# Patient Record
Sex: Female | Born: 1940 | Race: Black or African American | Hispanic: No | Smoking: Never smoker
Health system: Southern US, Community
[De-identification: ages and names within clinical notes are randomized; demographics above are authoritative.]

## PROBLEM LIST (undated history)

## (undated) DIAGNOSIS — I5189 Other ill-defined heart diseases: Secondary | ICD-10-CM

## (undated) DIAGNOSIS — I499 Cardiac arrhythmia, unspecified: Secondary | ICD-10-CM

## (undated) DIAGNOSIS — I447 Left bundle-branch block, unspecified: Secondary | ICD-10-CM

## (undated) DIAGNOSIS — E119 Type 2 diabetes mellitus without complications: Secondary | ICD-10-CM

## (undated) DIAGNOSIS — Z972 Presence of dental prosthetic device (complete) (partial): Secondary | ICD-10-CM

## (undated) DIAGNOSIS — E785 Hyperlipidemia, unspecified: Secondary | ICD-10-CM

## (undated) DIAGNOSIS — I10 Essential (primary) hypertension: Secondary | ICD-10-CM

## (undated) HISTORY — DX: Type 2 diabetes mellitus without complications: E11.9

## (undated) HISTORY — DX: Cardiac arrhythmia, unspecified: I49.9

## (undated) HISTORY — DX: Other ill-defined heart diseases: I51.89

## (undated) HISTORY — DX: Left bundle-branch block, unspecified: I44.7

## (undated) HISTORY — PX: KNEE ARTHROSCOPY: SUR90

## (undated) HISTORY — DX: Essential (primary) hypertension: I10

## (undated) HISTORY — DX: Hyperlipidemia, unspecified: E78.5

---

## 2004-02-19 ENCOUNTER — Emergency Department: Payer: Self-pay | Admitting: Unknown Physician Specialty

## 2004-02-27 ENCOUNTER — Ambulatory Visit: Payer: Self-pay | Admitting: Internal Medicine

## 2004-06-03 ENCOUNTER — Ambulatory Visit: Payer: Self-pay | Admitting: Specialist

## 2004-06-03 ENCOUNTER — Other Ambulatory Visit: Payer: Self-pay

## 2004-06-10 ENCOUNTER — Ambulatory Visit: Payer: Self-pay | Admitting: Specialist

## 2007-05-14 ENCOUNTER — Emergency Department: Payer: Self-pay | Admitting: Emergency Medicine

## 2008-08-21 ENCOUNTER — Emergency Department: Payer: Self-pay

## 2011-03-18 ENCOUNTER — Ambulatory Visit: Payer: Self-pay | Admitting: Internal Medicine

## 2011-11-05 ENCOUNTER — Emergency Department: Payer: Self-pay | Admitting: Emergency Medicine

## 2014-08-26 ENCOUNTER — Other Ambulatory Visit: Payer: Self-pay | Admitting: Internal Medicine

## 2014-08-26 DIAGNOSIS — Z1231 Encounter for screening mammogram for malignant neoplasm of breast: Secondary | ICD-10-CM

## 2014-09-04 ENCOUNTER — Ambulatory Visit
Admission: RE | Admit: 2014-09-04 | Discharge: 2014-09-04 | Disposition: A | Discharge status: Home / Self Care | Payer: Medicare Other | Source: Ambulatory Visit | Attending: Internal Medicine | Admitting: Internal Medicine

## 2014-09-04 ENCOUNTER — Other Ambulatory Visit: Payer: Self-pay | Admitting: Internal Medicine

## 2014-09-04 DIAGNOSIS — Z1231 Encounter for screening mammogram for malignant neoplasm of breast: Secondary | ICD-10-CM | POA: Insufficient documentation

## 2015-10-19 ENCOUNTER — Other Ambulatory Visit: Payer: Self-pay | Admitting: Internal Medicine

## 2015-10-19 DIAGNOSIS — Z1231 Encounter for screening mammogram for malignant neoplasm of breast: Secondary | ICD-10-CM

## 2015-10-20 ENCOUNTER — Ambulatory Visit
Admission: RE | Admit: 2015-10-20 | Discharge: 2015-10-20 | Disposition: A | Discharge status: Home / Self Care | Payer: Medicare Other | Source: Ambulatory Visit | Attending: Internal Medicine | Admitting: Internal Medicine

## 2015-10-20 DIAGNOSIS — Z1231 Encounter for screening mammogram for malignant neoplasm of breast: Secondary | ICD-10-CM | POA: Insufficient documentation

## 2017-03-29 ENCOUNTER — Encounter: Payer: Self-pay | Admitting: Primary Care

## 2017-03-29 ENCOUNTER — Ambulatory Visit (INDEPENDENT_AMBULATORY_CARE_PROVIDER_SITE_OTHER): Payer: Medicare Other | Admitting: Primary Care

## 2017-03-29 VITALS — BP 126/78 | HR 75 | Temp 98.2°F | Ht 66.0 in | Wt 230.0 lb

## 2017-03-29 DIAGNOSIS — E119 Type 2 diabetes mellitus without complications: Secondary | ICD-10-CM | POA: Diagnosis not present

## 2017-03-29 DIAGNOSIS — E785 Hyperlipidemia, unspecified: Secondary | ICD-10-CM | POA: Diagnosis not present

## 2017-03-29 DIAGNOSIS — I10 Essential (primary) hypertension: Secondary | ICD-10-CM

## 2017-03-29 NOTE — Assessment & Plan Note (Signed)
Endorses being off of metoprolol succinate and clonidine since October 2018.  Blood pressure in the office today stable off of medications.  We will have her start monitoring blood pressure at home, continue to hold medications as her blood pressure is within normal limits.

## 2017-03-29 NOTE — Assessment & Plan Note (Signed)
Managed on lovastatin 20 mg for years.  Lipids pending, she will return when fasting.

## 2017-03-29 NOTE — Patient Instructions (Addendum)
Continue to time your urination.  Limit water before bedtime, do not drink within 1-2 hours prior to bedtime.  Try the kegal exercises below.   Start monitoring your blood pressure at home or at the drug store. Please notify me if you get readings at or above 140/90.  Schedule a lab only appointment to return back for fasting labs. You may have water and black coffee. Do not eat 4 hours prior.  Please schedule a follow up appointment in 6 months.   It was a pleasure to meet you today! Please don't hesitate to call or message me with any questions. Welcome to [facility]!     Kegel Exercises Kegel exercises help strengthen the muscles that support the rectum, vagina, small intestine, bladder, and uterus. Doing Kegel exercises can help:  Improve bladder and bowel control.  Improve sexual response.  Reduce problems and discomfort during pregnancy.  Kegel exercises involve squeezing your pelvic floor muscles, which are the same muscles you squeeze when you try to stop the flow of urine. The exercises can be done while sitting, standing, or lying down, but it is best to vary your position. Phase 1 exercises 1. Squeeze your pelvic floor muscles tight. You should feel a tight lift in your rectal area. If you are a female, you should also feel a tightness in your vaginal area. Keep your stomach, buttocks, and legs relaxed. 2. Hold the muscles tight for up to 10 seconds. 3. Relax your muscles. Repeat this exercise 50 times a day or as many times as told by your health care provider. Continue to do this exercise for at least 4-6 weeks or for as long as told by your health care provider. This information is not intended to replace advice given to you by your health care provider. Make sure you discuss any questions you have with your health care provider. Document Released: 01/04/2012 Document Revised: 09/12/2015 Document Reviewed: 12/07/2014 Elsevier Interactive Patient Education  [employer]2018  Elsevier Inc.

## 2017-03-29 NOTE — Progress Notes (Signed)
Subjective:    Patient ID: Belinda EchevariaMary L Patton, female    DOB: [birth year], 77 y.o.   MRN: [id number]  HPI  Belinda Patton is a 77 year old female who presents today to establish care and discuss the problems mentioned below. Will obtain old records.  1) Hyperlipidemia: Currently managed on lovastatin 20 mg. Her last cholesterol check was in October she thinks, not sure if it was normal. She is not fasting today.   2) Type 2 Diabetes: Currently managed on saxagliptin-metformin 05-998 mg once daily. She thinks her A1C was last checked in October 2018.  She is not sure of her last A1c number.  3) Essential Hypertension: Currently managed on metoprolol succinate 100 mg and clonidine 0.2 mg twice daily. She's been on this regimen for years. She denies chest pain, shortness of breath, headaches. She ran out of her blood pressure medication in October 2018. She does not check her blood pressure at home.   BP Readings from Last 3 Encounters:  03/29/17 126/78   4) Urinary Incontinence: Present for the past three months. Mostly occurs at night when she gets up to use the bathroom. If she doesn't get to the bathroom soon enough she'll wet herself. She's also started notice leakage of urine during the day and is now wearing pads. She has trained herself to urinate every 1-2 hours. She drinks coffee every morning with her last cup being at 9 am. She doesn't drink soda or tea. She drinks three bottles of water daily with her last dose being before bedtime.   She receives pads through her insurance. She does not experience urinary tract infections.    Review of Systems  Constitutional: Negative for unexpected weight change.  Eyes: Negative for visual disturbance.  Respiratory: Negative for shortness of breath.   Cardiovascular: Negative for chest pain.  Genitourinary:       Urinary incontinence  Neurological: Negative for dizziness, numbness and headaches.       Past Medical History:  Diagnosis Date  .  Essential hypertension   . Hyperlipidemia   . Type 2 diabetes mellitus (HCC)      Social History   Socioeconomic History  . Marital status: Widowed    Spouse name: Not on file  . Number of children: Not on file  . Years of education: Not on file  . Highest education level: Not on file  Social Needs  . Financial resource strain: Not on file  . Food insecurity - worry: Not on file  . Food insecurity - inability: Not on file  . Transportation needs - medical: Not on file  . Transportation needs - non-medical: Not on file  Occupational History  . Not on file  Tobacco Use  . Smoking status: Never Smoker  . Smokeless tobacco: Never Used  Substance and Sexual Activity  . Alcohol use: No    Frequency: Never  . Drug use: Not on file  . Sexual activity: Not on file  Other Topics Concern  . Not on file  Social History Narrative   Widower.   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   Enjoys spending time with grandchildren, gardening.    Past Surgical History:  Procedure Laterality Date  . KNEE ARTHROSCOPY Right     Family History  Problem Relation Age of Onset  . Hypertension Mother   . COPD Father   . Lung cancer Father   . Diabetes Paternal Grandfather   . Breast cancer  Neg Hx     Allergies  Allergen Reactions  . Penicillins Itching    Current Outpatient Medications on File Prior to Visit  Medication Sig Dispense Refill  . cloNIDine (CATAPRES) 0.2 MG tablet Take 1 tablet by mouth twice a day for blood pressure    . lovastatin (MEVACOR) 20 MG tablet TAKE 1 TABLET BY MOUTH ONCE A DAY FOR LIPIDS  4  . metoprolol succinate (TOPROL-XL) 100 MG 24 hr tablet Take 1 tablet by mouth every night at bedtime for high blood pressure    . Saxagliptin-Metformin (KOMBIGLYZE XR) 05-998 MG TB24 Take 1 tablet by mouth daily.     No current facility-administered medications on file prior to visit.     BP 126/78   Pulse 75   Temp 98.2 F (36.8  C) (Oral)   Ht 5\' 6"  (1.676 m)   Wt 230 lb (104.3 kg)   SpO2 99%   BMI 37.12 kg/m    Objective:   Physical Exam  Constitutional: She appears well-nourished.  Neck: Neck supple.  Cardiovascular: Normal rate and regular rhythm.  Pulmonary/Chest: Effort normal and breath sounds normal.  Skin: Skin is warm and dry.  Psychiatric: She has a normal mood and affect.          Assessment & Plan:

## 2017-03-29 NOTE — Assessment & Plan Note (Signed)
Managed on saxagliptin-metformin once daily.  Repeat A1c pending.  Urine microalbumin pending.

## 2017-03-31 ENCOUNTER — Other Ambulatory Visit: Payer: Self-pay | Admitting: Primary Care

## 2017-03-31 ENCOUNTER — Other Ambulatory Visit (INDEPENDENT_AMBULATORY_CARE_PROVIDER_SITE_OTHER): Payer: Medicare Other

## 2017-03-31 DIAGNOSIS — E785 Hyperlipidemia, unspecified: Secondary | ICD-10-CM

## 2017-03-31 DIAGNOSIS — E119 Type 2 diabetes mellitus without complications: Secondary | ICD-10-CM

## 2017-03-31 LAB — LIPID PANEL
CHOL/HDL RATIO: 2
Cholesterol: 132 mg/dL (ref 0–200)
HDL: 67.7 mg/dL (ref >39.00)
LDL CALC: 52 mg/dL (ref 0–99)
NonHDL: 63.88
Triglycerides: 57 mg/dL (ref 0.0–149.0)
VLDL: 11.4 mg/dL (ref 0.0–40.0)

## 2017-03-31 LAB — COMPREHENSIVE METABOLIC PANEL
ALT: 11 U/L (ref 0–35)
AST: 16 U/L (ref 0–37)
Albumin: 3.9 g/dL (ref 3.5–5.2)
Alkaline Phosphatase: 73 U/L (ref 39–117)
BUN: 24 mg/dL — AB (ref 6–23)
CHLORIDE: 107 meq/L (ref 96–112)
CO2: 29 meq/L (ref 19–32)
Calcium: 10.1 mg/dL (ref 8.4–10.5)
Creatinine, Ser: 1.18 mg/dL (ref 0.40–1.20)
GFR: 56.88 mL/min — AB (ref >60.00)
GLUCOSE: 127 mg/dL — AB (ref 70–99)
POTASSIUM: 4.5 meq/L (ref 3.5–5.1)
SODIUM: 142 meq/L (ref 135–145)
Total Bilirubin: 0.6 mg/dL (ref 0.2–1.2)
Total Protein: 7.6 g/dL (ref 6.0–8.3)

## 2017-03-31 LAB — MICROALBUMIN / CREATININE URINE RATIO
Creatinine,U: 177.7 mg/dL
Microalb Creat Ratio: 1.3 mg/g (ref 0.0–30.0)
Microalb, Ur: 2.3 mg/dL — ABNORMAL HIGH (ref 0.0–1.9)

## 2017-03-31 LAB — HEMOGLOBIN A1C: Hgb A1c MFr Bld: 7 % — ABNORMAL HIGH (ref 4.6–6.5)

## 2017-03-31 MED ORDER — SAXAGLIPTIN-METFORMIN ER 5-1000 MG PO TB24: ORAL_TABLET | ORAL | 1 refills | Status: DC

## 2017-03-31 MED ORDER — LOVASTATIN 20 MG PO TABS: ORAL_TABLET | ORAL | 3 refills | Status: DC

## 2017-09-26 ENCOUNTER — Ambulatory Visit: Payer: Medicare Other | Admitting: Primary Care

## 2017-09-26 DIAGNOSIS — Z0289 Encounter for other administrative examinations: Secondary | ICD-10-CM

## 2018-08-01 ENCOUNTER — Other Ambulatory Visit: Payer: Self-pay | Admitting: Family Medicine

## 2018-08-01 DIAGNOSIS — Z1231 Encounter for screening mammogram for malignant neoplasm of breast: Secondary | ICD-10-CM

## 2018-08-16 ENCOUNTER — Other Ambulatory Visit: Payer: Self-pay | Admitting: Family Medicine

## 2018-08-16 DIAGNOSIS — M79604 Pain in right leg: Secondary | ICD-10-CM

## 2018-08-16 DIAGNOSIS — Z13820 Encounter for screening for osteoporosis: Secondary | ICD-10-CM

## 2018-08-22 ENCOUNTER — Ambulatory Visit
Admission: RE | Admit: 2018-08-22 | Discharge: 2018-08-22 | Disposition: A | Discharge status: Home / Self Care | Payer: Medicare Other | Source: Ambulatory Visit | Attending: Family Medicine | Admitting: Family Medicine

## 2018-08-22 ENCOUNTER — Other Ambulatory Visit: Payer: Self-pay

## 2018-08-22 ENCOUNTER — Encounter (INDEPENDENT_AMBULATORY_CARE_PROVIDER_SITE_OTHER): Payer: Self-pay

## 2018-08-22 DIAGNOSIS — M79604 Pain in right leg: Secondary | ICD-10-CM | POA: Diagnosis present

## 2018-08-29 ENCOUNTER — Other Ambulatory Visit: Payer: Self-pay

## 2018-08-29 ENCOUNTER — Ambulatory Visit (INDEPENDENT_AMBULATORY_CARE_PROVIDER_SITE_OTHER): Payer: Medicare Other | Admitting: Cardiology

## 2018-08-29 ENCOUNTER — Telehealth: Payer: Self-pay

## 2018-08-29 ENCOUNTER — Encounter: Payer: Self-pay | Admitting: Cardiology

## 2018-08-29 VITALS — BP 158/92 | HR 80 | Ht 69.0 in | Wt 229.5 lb

## 2018-08-29 DIAGNOSIS — I447 Left bundle-branch block, unspecified: Secondary | ICD-10-CM

## 2018-08-29 DIAGNOSIS — M7989 Other specified soft tissue disorders: Secondary | ICD-10-CM

## 2018-08-29 DIAGNOSIS — I10 Essential (primary) hypertension: Secondary | ICD-10-CM | POA: Diagnosis not present

## 2018-08-29 DIAGNOSIS — R0602 Shortness of breath: Secondary | ICD-10-CM

## 2018-08-29 DIAGNOSIS — Z7189 Other specified counseling: Secondary | ICD-10-CM

## 2018-08-29 DIAGNOSIS — R5382 Chronic fatigue, unspecified: Secondary | ICD-10-CM

## 2018-08-29 NOTE — Telephone Encounter (Signed)

## 2018-08-29 NOTE — Progress Notes (Signed)
Cardiology Office Note:    Date:  08/29/2018   ID:  Belinda Patton, DOB Jun 10, 1940, MRN [id number]  PCP:  Doreene Nest, NP  Cardiologist:  Jodelle Red, MD PhD  Referring MD: Doreene Nest, NP   Chief Complaint  Patient presents with  . OTHER    Irregular heart beat and ankle swelling. Meds reviewed verbally with pt.    History of Present Illness:    Belinda Patton is a 78 y.o. female with a hx of hypertension, type II diabetes who is seen as a new consult at the request of Doreene Nest, NP for the evaluation and management of irregular heartbeat, abnormal ECG.  Her concerns today: Has felt short of breath, gets tired more easily. This has been gradual over the last year or so. No clear event that started it.   Per notes, reported irregular heartbeat at a visit, but ECG not available at the time. On repeat visit several weeks later, was regular sinus rhythm. She was feeling fine at the time. She notes racing beats when she is upset, which happens very rarely. No syncope. No chest pain. Never been told she has issues with her heart.   No PND or orthopnea. Has chronic LE edema, L>R, for some time (not sure how long). Weight fluctuates within 5-10 lbs, and she has actually been losing weight since cutting back on high sugar foods.  She reports being very nervous for her visit today. Doesn't check BP at home. Per notes, most recently at PCP her Bps were 154/88 and 147/77.   Family history: Son had stents put in, died age 67, was a heavy drinker.    Past Medical History:  Diagnosis Date  . Essential hypertension   . Hyperlipidemia   . Type 2 diabetes mellitus (HCC)     Past Surgical History:  Procedure Laterality Date  . KNEE ARTHROSCOPY Right     Current Medications: Current Outpatient Medications on File Prior to Visit  Medication Sig  . lisinopril (ZESTRIL) 2.5 MG tablet Take 2.5 mg by mouth daily.  . metFORMIN (GLUCOPHAGE) 500 MG tablet Take 500 mg  by mouth daily with breakfast.   . metoprolol succinate (TOPROL-XL) 100 MG 24 hr tablet Take 100 mg by mouth daily. Take with or immediately following a meal.   No current facility-administered medications on file prior to visit.      Allergies:   Bee venom and Penicillins   Social History   Socioeconomic History  . Marital status: Widowed    Spouse name: Not on file  . Number of children: Not on file  . Years of education: Not on file  . Highest education level: Not on file  Occupational History  . Not on file  Social Needs  . Financial resource strain: Not on file  . Food insecurity    Worry: Not on file    Inability: Not on file  . Transportation needs    Medical: Not on file    Non-medical: Not on file  Tobacco Use  . Smoking status: Never Smoker  . Smokeless tobacco: Never Used  Substance and Sexual Activity  . Alcohol use: No    Frequency: Never  . Drug use: Not Currently  . Sexual activity: Not on file  Lifestyle  . Physical activity    Days per week: Not on file    Minutes per session: Not on file  . Stress: Not on file  Relationships  . Social connections  Talks on phone: Not on file    Gets together: Not on file    Attends religious service: Not on file    Active member of club or organization: Not on file    Attends meetings of clubs or organizations: Not on file    Relationship status: Not on file  Other Topics Concern  . Not on file  Social History Narrative   Widower.   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   Enjoys spending time with grandchildren, gardening.     Family History: The patient's family history includes COPD in her father; Diabetes in her paternal grandfather; Heart Problems in her son; Hypertension in her mother; Lung cancer in her father. There is no history of Breast cancer.  ROS:   Please see the history of present illness.  Additional pertinent ROS:  Constitutional: Negative for  chills, fever, night sweats, unintentional weight loss  HENT: Negative for ear pain and hearing loss.   Eyes: Negative for loss of vision and eye pain.  Respiratory: Negative for cough, sputum, shortness of breath, wheezing.   Cardiovascular: See HPI. Gastrointestinal: Negative for abdominal pain, melena, and hematochezia.  Genitourinary: Negative for dysuria and hematuria.  Musculoskeletal: Negative for falls and myalgias.  Skin: Negative for itching and rash.  Neurological: Negative for focal weakness, focal sensory changes and loss of consciousness.  Endo/Heme/Allergies: Does not bruise/bleed easily.    EKGs/Labs/Other Studies Reviewed:    The following studies were reviewed today: I reviewed records send from PCP. On my review, at visit 08/01/18 her rate was irregular on physical exam but no ECG available. When she was seen for follow up on 08/16/18, her rhythm was regular, ECG showed LBBB. This was also seen on prior ECG (most recent prior here 2010)  EKG:  EKG is personally reviewed.  The ekg ordered today demonstrates SR at 80 bpm, LBBB  Recent Labs: No results found for requested labs within last 8760 hours.  Recent Lipid Panel    Component Value Date/Time   CHOL 132 03/31/2017 0857   TRIG 57.0 03/31/2017 0857   HDL 67.70 03/31/2017 0857   CHOLHDL 2 03/31/2017 0857   VLDL 11.4 03/31/2017 0857   LDLCALC 52 03/31/2017 0857    Physical Exam:    VS:  BP (!) 158/92 (BP Location: Right Arm, Patient Position: Sitting, Cuff Size: Large)   Pulse 80   Ht 5\' 9"  (1.753 m)   Wt 229 lb 8 oz (104.1 kg)   BMI 33.89 kg/m     Wt Readings from Last 3 Encounters:  08/29/18 229 lb 8 oz (104.1 kg)  03/29/17 230 lb (104.3 kg)     GEN: Well nourished, well developed in no acute distress HEENT: Normal NECK: No JVD; No carotid bruits LYMPHATICS: No lymphadenopathy CARDIAC: regular rhythm, normal S1 and S2, no murmurs, rubs, gallops. Radial 2+ bilaterally. Unable to palpate pulses  through edema in lower extremities but they are warm with normal capillary refill. RESPIRATORY:  Clear to auscultation without rales, wheezing or rhonchi  ABDOMEN: Soft, non-tender, non-distended MUSCULOSKELETAL:  Bilateral edema, L>R, trace on R and 1+ on left SKIN: Warm and dry NEUROLOGIC:  Alert and oriented x 3 PSYCHIATRIC:  Normal affect   ASSESSMENT:    1. SOB (shortness of breath)   2. Left bundle branch block   3. Swelling of lower extremity   4. Essential hypertension   5. Cardiac risk counseling   6. Counseling on health promotion  and disease prevention   7. Chronic fatigue    PLAN:    Fatigue, exertional shortness of breath, LE edema in the setting of LBBB: her LBBB is not new (>80 years old), but her progressive symptoms are concerning that she may have some dysfunction due to longstanding dyssynchrony. No prior echo that I can see. -echocardiogram -no chest pain ever, but if echo unrevealing could consider lexiscan for further evaluation -risk factors of age, postmenopausal, diabetes, hypertension -last LDL 78 per records. PCP notes state she is on lovastatin 20 mg daily, she is unsure if she is taking this.  Hypertension: not at goal today, but consistent with prior readings at PCP -on lisinopril 2.5 mg, metoprolol succinate 100 mg daily -would uptitrate lisinopril if remains not at goal if echo abnormal. Otherwise consider spironolactone if evidence of diastolic dysfunction or thiazide given LE edema. -notes from PCP also state she is on clonidine 0.2 mg BID, she is unclear if she is taking this.  -as it is unclear what she is taking, will defer to PCP on titration at this time.  Cardiac risk counseling and prevention recommendations: -recommend heart healthy/Mediterranean diet, with whole grains, fruits, vegetable, fish, lean meats, nuts, and olive oil. Limit salt. -recommend moderate walking, 3-5 times/week for 30-50 minutes each session. Aim for at least 150  minutes.week. Goal should be pace of 3 miles/hours, or walking 1.5 miles in 30 minutes -recommend avoidance of tobacco products. Avoid excess alcohol. -diabetes, hypertension, hyperlipidemia management as above  Irregular beats: very rare, brief. Unclear what her rhythm was on 7/1 as ECG not done, but as this is rare and not very symptomatic would watch for now. If becomes more frequent would order a monitor.  Plan for follow up: 6 mos or sooner PRN  Medication Adjustments/Labs and Tests Ordered: Current medicines are reviewed at length with the patient today.  Concerns regarding medicines are outlined above.  Orders Placed This Encounter  Procedures  . ECHOCARDIOGRAM COMPLETE   No orders of the defined types were placed in this encounter.   Patient Instructions  Medication Instructions:  Your physician recommends that you continue on your current medications as directed. Please refer to the Current Medication list given to you today.   If you need a refill on your cardiac medications before your next appointment, please call your pharmacy.   Lab work: - None ordered.  If you have labs (blood work) drawn today and your tests are completely normal, you will receive your results only by: Marland Kitchen. MyChart Message (if you have MyChart) OR . A paper copy in the mail If you have any lab test that is abnormal or we need to change your treatment, we will call you to review the results.  Testing/Procedures: Your physician has requested that you have an echocardiogram. Echocardiography is a painless test that uses sound waves to create images of your heart. It provides your doctor with information about the size and shape of your heart and how well your heart's chambers and valves are working. This procedure takes approximately one hour. There are no restrictions for this procedure. You may get an IV, if needed, to receive an ultrasound enhancing agent through to better visualize your heart.     Follow-Up: At [facility], you and your health needs are our priority.  As part of our continuing mission to provide you with exceptional heart care, we have created designated Provider Care Teams.  These Care Teams include your primary Cardiologist (physician) and Advanced  Practice Providers (APPs -  Physician Assistants and Nurse Practitioners) who all work together to provide you with the care you need, when you need it. You will need a follow up appointment in 6 months.  Please call our office 2 months in advance to schedule this appointment.  You may see Dr Jodelle RedBridgette Harshal Sirmon or one of the following Advanced Practice Providers on your designated Care Team:   Nicolasa Duckinghristopher Berge, NP Eula Listenyan Dunn, PA-C . Marisue IvanJacquelyn Visser, PA-C     Echocardiogram An echocardiogram is a procedure that uses painless sound waves (ultrasound) to produce an image of the heart. Images from an echocardiogram can provide important information about:  Signs of coronary artery disease (CAD).  Aneurysm detection. An aneurysm is a weak or damaged part of an artery wall that bulges out from the normal force of blood pumping through the body.  Heart size and shape. Changes in the size or shape of the heart can be associated with certain conditions, including heart failure, aneurysm, and CAD.  Heart muscle function.  Heart valve function.  Signs of a past heart attack.  Fluid buildup around the heart.  Thickening of the heart muscle.  A tumor or infectious growth around the heart valves. Tell a health care provider about:  Any allergies you have.  All medicines you are taking, including vitamins, herbs, eye drops, creams, and over-the-counter medicines.  Any blood disorders you have.  Any surgeries you have had.  Any medical conditions you have.  Whether you are pregnant or may be pregnant. What are the risks? Generally, this is a safe procedure. However, problems may occur, including:  Allergic  reaction to dye (contrast) that may be used during the procedure. What happens before the procedure? No specific preparation is needed. You may eat and drink normally. What happens during the procedure?   An IV tube may be inserted into one of your veins.  You may receive contrast through this tube. A contrast is an injection that improves the quality of the pictures from your heart.  A gel will be applied to your chest.  A wand-like tool (transducer) will be moved over your chest. The gel will help to transmit the sound waves from the transducer.  The sound waves will harmlessly bounce off of your heart to allow the heart images to be captured in real-time motion. The images will be recorded on a computer. The procedure may vary among health care providers and hospitals. What happens after the procedure?  You may return to your normal, everyday life, including diet, activities, and medicines, unless your health care provider tells you not to do that. Summary  An echocardiogram is a procedure that uses painless sound waves (ultrasound) to produce an image of the heart.  Images from an echocardiogram can provide important information about the size and shape of your heart, heart muscle function, heart valve function, and fluid buildup around your heart.  You do not need to do anything to prepare before this procedure. You may eat and drink normally.  After the echocardiogram is completed, you may return to your normal, everyday life, unless your health care provider tells you not to do that. This information is not intended to replace advice given to you by your health care provider. Make sure you discuss any questions you have with your health care provider. Document Released: 01/15/2000 Document Revised: 05/10/2018 Document Reviewed: 02/20/2016 Elsevier Patient Education  2020 [employer]      Signed, Jodelle RedBridgette Nava Song, MD PhD  08/29/2018 4:49 PM    [facility]

## 2018-08-29 NOTE — Patient Instructions (Signed)
Medication Instructions:  Your physician recommends that you continue on your current medications as directed. Please refer to the Current Medication list given to you today.   If you need a refill on your cardiac medications before your next appointment, please call your pharmacy.   Lab work: - None ordered.  If you have labs (blood work) drawn today and your tests are completely normal, you will receive your results only by: Marland Kitchen. MyChart Message (if you have MyChart) OR . A paper copy in the mail If you have any lab test that is abnormal or we need to change your treatment, we will call you to review the results.  Testing/Procedures: Your physician has requested that you have an echocardiogram. Echocardiography is a painless test that uses sound waves to create images of your heart. It provides your doctor with information about the size and shape of your heart and how well your heart's chambers and valves are working. This procedure takes approximately one hour. There are no restrictions for this procedure. You may get an IV, if needed, to receive an ultrasound enhancing agent through to better visualize your heart.    Follow-Up: At [facility], you and your health needs are our priority.  As part of our continuing mission to provide you with exceptional heart care, we have created designated Provider Care Teams.  These Care Teams include your primary Cardiologist (physician) and Advanced Practice Providers (APPs -  Physician Assistants and Nurse Practitioners) who all work together to provide you with the care you need, when you need it. You will need a follow up appointment in 6 months.  Please call our office 2 months in advance to schedule this appointment.  You may see Dr Jodelle RedBridgette Christopher or one of the following Advanced Practice Providers on your designated Care Team:   Nicolasa Duckinghristopher Berge, NP Eula Listenyan Dunn, PA-C . Marisue IvanJacquelyn Visser, PA-C     Echocardiogram An echocardiogram is a  procedure that uses painless sound waves (ultrasound) to produce an image of the heart. Images from an echocardiogram can provide important information about:  Signs of coronary artery disease (CAD).  Aneurysm detection. An aneurysm is a weak or damaged part of an artery wall that bulges out from the normal force of blood pumping through the body.  Heart size and shape. Changes in the size or shape of the heart can be associated with certain conditions, including heart failure, aneurysm, and CAD.  Heart muscle function.  Heart valve function.  Signs of a past heart attack.  Fluid buildup around the heart.  Thickening of the heart muscle.  A tumor or infectious growth around the heart valves. Tell a health care provider about:  Any allergies you have.  All medicines you are taking, including vitamins, herbs, eye drops, creams, and over-the-counter medicines.  Any blood disorders you have.  Any surgeries you have had.  Any medical conditions you have.  Whether you are pregnant or may be pregnant. What are the risks? Generally, this is a safe procedure. However, problems may occur, including:  Allergic reaction to dye (contrast) that may be used during the procedure. What happens before the procedure? No specific preparation is needed. You may eat and drink normally. What happens during the procedure?   An IV tube may be inserted into one of your veins.  You may receive contrast through this tube. A contrast is an injection that improves the quality of the pictures from your heart.  A gel will be applied to your  chest.  A wand-like tool (transducer) will be moved over your chest. The gel will help to transmit the sound waves from the transducer.  The sound waves will harmlessly bounce off of your heart to allow the heart images to be captured in real-time motion. The images will be recorded on a computer. The procedure may vary among health care providers and hospitals.  What happens after the procedure?  You may return to your normal, everyday life, including diet, activities, and medicines, unless your health care provider tells you not to do that. Summary  An echocardiogram is a procedure that uses painless sound waves (ultrasound) to produce an image of the heart.  Images from an echocardiogram can provide important information about the size and shape of your heart, heart muscle function, heart valve function, and fluid buildup around your heart.  You do not need to do anything to prepare before this procedure. You may eat and drink normally.  After the echocardiogram is completed, you may return to your normal, everyday life, unless your health care provider tells you not to do that. This information is not intended to replace advice given to you by your health care provider. Make sure you discuss any questions you have with your health care provider. Document Released: 01/15/2000 Document Revised: 05/10/2018 Document Reviewed: 02/20/2016 Elsevier Patient Education  2020 [employer].

## 2018-08-30 NOTE — Addendum Note (Signed)
Addended by: Britt Bottom on: 08/30/2018 08:32 AM   Modules accepted: Orders

## 2018-09-17 ENCOUNTER — Ambulatory Visit
Admission: RE | Admit: 2018-09-17 | Discharge: 2018-09-17 | Disposition: A | Discharge status: Home / Self Care | Payer: Medicare Other | Source: Ambulatory Visit | Attending: Family Medicine | Admitting: Family Medicine

## 2018-09-17 DIAGNOSIS — Z1231 Encounter for screening mammogram for malignant neoplasm of breast: Secondary | ICD-10-CM | POA: Insufficient documentation

## 2018-09-18 ENCOUNTER — Other Ambulatory Visit: Payer: Self-pay

## 2018-09-18 ENCOUNTER — Other Ambulatory Visit (INDEPENDENT_AMBULATORY_CARE_PROVIDER_SITE_OTHER): Payer: Medicare Other

## 2018-09-18 DIAGNOSIS — R0602 Shortness of breath: Secondary | ICD-10-CM | POA: Diagnosis not present

## 2018-09-18 DIAGNOSIS — I447 Left bundle-branch block, unspecified: Secondary | ICD-10-CM | POA: Diagnosis not present

## 2018-09-18 DIAGNOSIS — M7989 Other specified soft tissue disorders: Secondary | ICD-10-CM | POA: Diagnosis not present

## 2018-09-18 MED ORDER — PERFLUTREN LIPID MICROSPHERE
1.0000 mL | INTRAVENOUS | Status: AC | PRN
  Administered 2018-09-18: 2 mL via INTRAVENOUS

## 2018-09-27 ENCOUNTER — Ambulatory Visit: Payer: Medicare Other | Admitting: Primary Care

## 2019-05-31 ENCOUNTER — Encounter: Payer: Self-pay | Admitting: Nurse Practitioner

## 2019-05-31 ENCOUNTER — Other Ambulatory Visit: Payer: Self-pay

## 2019-05-31 ENCOUNTER — Ambulatory Visit (INDEPENDENT_AMBULATORY_CARE_PROVIDER_SITE_OTHER): Payer: Medicare Other | Admitting: Nurse Practitioner

## 2019-05-31 VITALS — BP 128/68 | HR 60 | Ht 68.0 in | Wt 231.1 lb

## 2019-05-31 DIAGNOSIS — R0609 Other forms of dyspnea: Secondary | ICD-10-CM

## 2019-05-31 DIAGNOSIS — I447 Left bundle-branch block, unspecified: Secondary | ICD-10-CM | POA: Diagnosis not present

## 2019-05-31 DIAGNOSIS — R0600 Dyspnea, unspecified: Secondary | ICD-10-CM | POA: Diagnosis not present

## 2019-05-31 DIAGNOSIS — E119 Type 2 diabetes mellitus without complications: Secondary | ICD-10-CM

## 2019-05-31 DIAGNOSIS — E782 Mixed hyperlipidemia: Secondary | ICD-10-CM

## 2019-05-31 DIAGNOSIS — I10 Essential (primary) hypertension: Secondary | ICD-10-CM

## 2019-05-31 NOTE — Patient Instructions (Signed)

## 2019-05-31 NOTE — Progress Notes (Signed)
Office Visit    Patient Name: Belinda Patton Date of Encounter: 05/31/2019  Primary Care Provider:  [facility] Primary Cardiologist:  Prev seen by Buford Dresser, MD  Chief Complaint    79 year old female with a history of hypertension, hyperlipidemia, type 2 diabetes mellitus, LBBB, and diastolic dysfunction, who presents for follow-up of dyspnea.  Past Medical History    Past Medical History:  Diagnosis Date  . Diastolic dysfunction    a. 09/2018 Echo: EF 50-55%, mod LVH, diast dysfxn, Nl RV fxn, mildly dil LA. No significant valvular dzs.  . Essential hypertension   . Hyperlipidemia   . Irregular heart beat   . LBBB (left bundle branch block)   . Type 2 diabetes mellitus ([place])    Past Surgical History:  Procedure Laterality Date  . KNEE ARTHROSCOPY Right     Allergies  Allergies  Allergen Reactions  . Bee Venom   . Penicillins Itching    History of Present Illness    79 year old female with the above past medical history including hypertension, hyperlipidemia, diabetes, and left bundle branch block.  She was evaluated by Dr. Harrell Gave in July 2020, in the setting of complaints of dyspnea and irregular heartbeat.  She was in sinus rhythm at the time with an old left bundle branch block.  Previous ECGs also showed sinus rhythm.  Echocardiogram was performed and showed an EF of 50-55% with moderate LVH, diastolic dysfunction, and normal RV function.  No significant valvular disease was noted.  In the setting of diastolic dysfunction, improved blood pressure control was recommended.  Since her last visit, she says she has continued to have dyspnea on exertion.  She walks 3-4 times per week, for about 30 minutes.  She usually walks down a hill and then back up it.  Ever since last summer, she notes more dyspnea on exertion and needs to stop more frequently.  She thinks she be able to walk about 50 yards on a flat surface prior to becoming tired.  We discussed  potentially pursuing stress testing to evaluate, at which point she noted that she believes that she has just reduced her activity to the point where she is more deconditioned than previously and would like to try to improve her conditioning prior to pursuing any additional testing.  She denies any prior history of chest pain and also notes that she is able to push mow her yard without significant limitations.  She denies palpitations, PND, orthopnea, dizziness, syncope, or early satiety.  She has chronic right knee swelling and sometimes notes ankle edema.  Home Medications    Prior to Admission medications   Medication Sig Start Date End Date Taking? Authorizing Provider  lisinopril (ZESTRIL) 2.5 MG tablet Take 2.5 mg by mouth daily.    [provider]  metFORMIN (GLUCOPHAGE) 500 MG tablet Take 500 mg by mouth daily with breakfast.     [provider]  metoprolol succinate (TOPROL-XL) 100 MG 24 hr tablet Take 100 mg by mouth daily. Take with or immediately following a meal.    [provider]  Aspirin 81 mg daily Atorvastatin unknown dose daily  Review of Systems    Persistent dyspnea on exertion as outlined above.  Occasional mild ankle edema with chronic right knee swelling.  She denies chest pain, palpitations, PND, orthopnea, dizziness, syncope, or early satiety.  All other systems reviewed and are otherwise negative except as noted above.  Physical Exam    VS:  BP 128/68 (  BP Location: Left Arm, Patient Position: Sitting, Cuff Size: Large)   Pulse 60   Ht 5\' 8"  (1.727 m)   Wt 231 lb 2 oz (104.8 kg)   SpO2 97%   BMI 35.14 kg/m  , BMI Body mass index is 35.14 kg/m. GEN: Well nourished, well developed, in no acute distress. HEENT: normal. Neck: Supple, no JVD, carotid bruits, or masses. Cardiac: RRR, no murmurs, rubs, or gallops. No clubbing, cyanosis, edema.  Radials/PT 2+ and equal bilaterally.  Respiratory:  Respirations regular and unlabored, clear to  auscultation bilaterally. GI: Soft, nontender, nondistended, BS + x 4. MS: no deformity or atrophy. Skin: warm and dry, no rash. Neuro:  Strength and sensation are intact. Psych: Normal affect.  Accessory Clinical Findings    ECG personally reviewed by me today -regular sinus rhythm, 60, left axis deviation, left bundle branch block- no acute changes.  No results found for: WBC, HGB, HCT, MCV, PLT Lab Results  Component Value Date   CREATININE 1.18 03/31/2017   BUN 24 (H) 03/31/2017   NA 142 03/31/2017   K 4.5 03/31/2017   CL 107 03/31/2017   CO2 29 03/31/2017   Lab Results  Component Value Date   ALT 11 03/31/2017   AST 16 03/31/2017   ALKPHOS 73 03/31/2017   BILITOT 0.6 03/31/2017   Lab Results  Component Value Date   CHOL 132 03/31/2017   HDL 67.70 03/31/2017   LDLCALC 52 03/31/2017   TRIG 57.0 03/31/2017   CHOLHDL 2 03/31/2017    Lab Results  Component Value Date   HGBA1C 7.0 (H) 03/31/2017    Assessment & Plan    1.  Dyspnea on exertion: Over the past 10 months or so, patient has been experiencing dyspnea on exertion after walking about 50 yards.  Echocardiogram in August 2020 showed normal LV function with diastolic dysfunction.  No significant valvular disease.  She has not had any chest pain.  She also notes that she is able to push mow her yard without significant limitations but then adds that she does have a transmission on her lower, so her effort is minimal.  We discussed potentially pursuing stress testing to rule out ischemia as dyspnea may be an anginal equivalent however, she notes that over the past year, she is simply reduce her activity and feels that she is deconditioned.  She would like to increase her activity prior to pursuing any additional testing, to see if she can improve her conditioning.  I encouraged her to walk 30 minutes daily and that if conditioning does not improve/dyspnea persists or worsens, we can always pursue stress testing at a  later date.  2.  Essential hypertension: Blood pressure stable today on beta-blocker and ACE inhibitor therapy.  3.  Hyperlipidemia: Patient was placed on atorvastatin therapy by her new primary care provider.  4.  Type 2 diabetes mellitus: On Metformin and followed by primary care.  5.  Disposition: Follow-up in 6 months or sooner if necessary.  September 2020, NP 05/31/2019, 12:18 PM

## 2019-10-29 ENCOUNTER — Other Ambulatory Visit: Payer: Self-pay

## 2019-10-29 ENCOUNTER — Encounter: Payer: Self-pay | Admitting: Ophthalmology

## 2019-11-01 ENCOUNTER — Other Ambulatory Visit
Admission: RE | Admit: 2019-11-01 | Discharge: 2019-11-01 | Disposition: A | Discharge status: Home / Self Care | Payer: Medicare Other | Source: Ambulatory Visit | Attending: Ophthalmology | Admitting: Ophthalmology

## 2019-11-01 DIAGNOSIS — Z01812 Encounter for preprocedural laboratory examination: Secondary | ICD-10-CM | POA: Insufficient documentation

## 2019-11-01 DIAGNOSIS — Z20822 Contact with and (suspected) exposure to covid-19: Secondary | ICD-10-CM | POA: Insufficient documentation

## 2019-11-01 NOTE — Discharge Instructions (Signed)

## 2019-11-02 LAB — SARS CORONAVIRUS 2 (TAT 6-24 HRS): SARS Coronavirus 2: NEGATIVE

## 2019-11-05 ENCOUNTER — Other Ambulatory Visit: Payer: Self-pay

## 2019-11-05 ENCOUNTER — Encounter
Admission: RE | Disposition: A | Discharge status: Home / Self Care | Payer: Self-pay | Source: Home / Self Care | Attending: Ophthalmology

## 2019-11-05 ENCOUNTER — Ambulatory Visit: Payer: Medicare Other | Admitting: Anesthesiology

## 2019-11-05 ENCOUNTER — Encounter: Payer: Self-pay | Admitting: Ophthalmology

## 2019-11-05 ENCOUNTER — Ambulatory Visit
Admission: RE | Admit: 2019-11-05 | Discharge: 2019-11-05 | Disposition: A | Discharge status: Home / Self Care | Payer: Medicare Other | Attending: Ophthalmology | Admitting: Ophthalmology

## 2019-11-05 DIAGNOSIS — Z7982 Long term (current) use of aspirin: Secondary | ICD-10-CM | POA: Insufficient documentation

## 2019-11-05 DIAGNOSIS — Z880 Allergy status to penicillin: Secondary | ICD-10-CM | POA: Insufficient documentation

## 2019-11-05 DIAGNOSIS — H2512 Age-related nuclear cataract, left eye: Secondary | ICD-10-CM | POA: Insufficient documentation

## 2019-11-05 DIAGNOSIS — E119 Type 2 diabetes mellitus without complications: Secondary | ICD-10-CM | POA: Diagnosis not present

## 2019-11-05 DIAGNOSIS — Z7984 Long term (current) use of oral hypoglycemic drugs: Secondary | ICD-10-CM | POA: Insufficient documentation

## 2019-11-05 DIAGNOSIS — I10 Essential (primary) hypertension: Secondary | ICD-10-CM | POA: Insufficient documentation

## 2019-11-05 DIAGNOSIS — Z79899 Other long term (current) drug therapy: Secondary | ICD-10-CM | POA: Diagnosis not present

## 2019-11-05 HISTORY — DX: Presence of dental prosthetic device (complete) (partial): Z97.2

## 2019-11-05 HISTORY — PX: CATARACT EXTRACTION W/PHACO: SHX586

## 2019-11-05 LAB — GLUCOSE, CAPILLARY
Glucose-Capillary: 119 mg/dL — ABNORMAL HIGH (ref 70–99)
Glucose-Capillary: 133 mg/dL — ABNORMAL HIGH (ref 70–99)

## 2019-11-05 SURGERY — PHACOEMULSIFICATION, CATARACT, WITH IOL INSERTION
Anesthesia: Monitor Anesthesia Care | Site: Eye | Laterality: Left

## 2019-11-05 MED ORDER — LIDOCAINE HCL (PF) 2 % IJ SOLN
INTRAOCULAR | Status: DC | PRN
  Administered 2019-11-05: 2 mL

## 2019-11-05 MED ORDER — BRIMONIDINE TARTRATE-TIMOLOL 0.2-0.5 % OP SOLN
OPHTHALMIC | Status: DC | PRN
  Administered 2019-11-05: 1 [drp] via OPHTHALMIC

## 2019-11-05 MED ORDER — ARMC OPHTHALMIC DILATING DROPS
1.0000 "application " | OPHTHALMIC | Status: DC | PRN
  Administered 2019-11-05 (×3): 1 via OPHTHALMIC

## 2019-11-05 MED ORDER — FENTANYL CITRATE (PF) 100 MCG/2ML IJ SOLN
INTRAMUSCULAR | Status: DC | PRN
Start: 2019-11-05 — End: 2019-11-05
  Administered 2019-11-05: 50 ug via INTRAVENOUS

## 2019-11-05 MED ORDER — LACTATED RINGERS IV SOLN: INTRAVENOUS | Status: DC

## 2019-11-05 MED ORDER — TETRACAINE HCL 0.5 % OP SOLN
1.0000 [drp] | OPHTHALMIC | Status: DC | PRN
  Administered 2019-11-05 (×2): 1 [drp] via OPHTHALMIC

## 2019-11-05 MED ORDER — NA CHONDROIT SULF-NA HYALURON 40-17 MG/ML IO SOLN
INTRAOCULAR | Status: DC | PRN
  Administered 2019-11-05: 1 mL via INTRAOCULAR

## 2019-11-05 MED ORDER — MOXIFLOXACIN HCL 0.5 % OP SOLN
OPHTHALMIC | Status: DC | PRN
  Administered 2019-11-05: 0.2 mL via OPHTHALMIC

## 2019-11-05 MED ORDER — EPINEPHRINE PF 1 MG/ML IJ SOLN
INTRAOCULAR | Status: DC | PRN
  Administered 2019-11-05: 62 mL via OPHTHALMIC

## 2019-11-05 MED ORDER — MIDAZOLAM HCL 2 MG/2ML IJ SOLN
INTRAMUSCULAR | Status: DC | PRN
  Administered 2019-11-05: 1 mg via INTRAVENOUS

## 2019-11-05 SURGICAL SUPPLY — 19 items
CANNULA ANT/CHMB 27G (MISCELLANEOUS) ×2 IMPLANT
CANNULA ANT/CHMB 27GA (MISCELLANEOUS) ×6 IMPLANT
GLOVE SURG LX 8.0 MICRO (GLOVE) ×2
GLOVE SURG LX STRL 8.0 MICRO (GLOVE) ×1 IMPLANT
GLOVE SURG TRIUMPH 8.0 PF LTX (GLOVE) ×3 IMPLANT
GOWN STRL REUS W/ TWL LRG LVL3 (GOWN DISPOSABLE) ×2 IMPLANT
GOWN STRL REUS W/TWL LRG LVL3 (GOWN DISPOSABLE) ×6
LENS IOL TECNIS EYHANCE 19.5 (Intraocular Lens) ×2 IMPLANT
MARKER SKIN DUAL TIP RULER LAB (MISCELLANEOUS) ×3 IMPLANT
NDL FILTER BLUNT 18X1 1/2 (NEEDLE) ×1 IMPLANT
NEEDLE FILTER BLUNT 18X 1/2SAF (NEEDLE) ×2
NEEDLE FILTER BLUNT 18X1 1/2 (NEEDLE) ×1 IMPLANT
PACK EYE AFTER SURG (MISCELLANEOUS) ×3 IMPLANT
PACK OPTHALMIC (MISCELLANEOUS) ×3 IMPLANT
PACK PORFILIO (MISCELLANEOUS) ×3 IMPLANT
SYR 3ML LL SCALE MARK (SYRINGE) ×3 IMPLANT
SYR TB 1ML LUER SLIP (SYRINGE) ×3 IMPLANT
WATER STERILE IRR 250ML POUR (IV SOLUTION) ×3 IMPLANT
WIPE NON LINTING 3.25X3.25 (MISCELLANEOUS) ×3 IMPLANT

## 2019-11-05 NOTE — Op Note (Signed)
PREOPERATIVE DIAGNOSIS:  Nuclear sclerotic cataract of the left eye.   POSTOPERATIVE DIAGNOSIS:  Nuclear sclerotic cataract of the left eye.   OPERATIVE PROCEDURE:@   SURGEON:  Galen Manila, MD.   ANESTHESIA:  Anesthesiologist: Jola Babinski, MD CRNA: Maree Krabbe, CRNA  1.      Managed anesthesia care. 2.     0.66ml of Shugarcaine was instilled following the paracentesis   COMPLICATIONS:  None.   TECHNIQUE:   Stop and chop   DESCRIPTION OF PROCEDURE:  The patient was examined and consented in the preoperative holding area where the aforementioned topical anesthesia was applied to the left eye and then brought back to the Operating Room where the left eye was prepped and draped in the usual sterile ophthalmic fashion and a lid speculum was placed. A paracentesis was created with the side port blade and the anterior chamber was filled with viscoelastic. A near clear corneal incision was performed with the steel keratome. A continuous curvilinear capsulorrhexis was performed with a cystotome followed by the capsulorrhexis forceps. Hydrodissection and hydrodelineation were carried out with BSS on a blunt cannula. The lens was removed in a stop and chop  technique and the remaining cortical material was removed with the irrigation-aspiration handpiece. The capsular bag was inflated with viscoelastic and the Technis [id number] lens was placed in the capsular bag without complication. The remaining viscoelastic was removed from the eye with the irrigation-aspiration handpiece. The wounds were hydrated. The anterior chamber was flushed with BSS and the eye was inflated to physiologic pressure. 0.70ml Vigamox was placed in the anterior chamber. The wounds were found to be water tight. The eye was dressed with Combigan. The patient was given protective glasses to wear throughout the day and a shield with which to sleep tonight. The patient was also given drops with which to begin a drop regimen today and  will follow-up with me in one day. Implant Name Type Inv. Item Serial No. Manufacturer Lot No. LRB No. Used Action  LENS II EYHANCE 19.5 - [id number] Intraocular Lens LENS II EYHANCE 19.5 [id number] JOHNSON   Left 1 Implanted    Procedure(s) with comments: CATARACT EXTRACTION PHACO AND INTRAOCULAR LENS PLACEMENT (IOC) LEFT DIABETIC 8.71 00:52.8 (Left) - Diabetic - oral meds  Electronically signed: Galen Manila 11/05/2019 7:52 AM

## 2019-11-05 NOTE — Transfer of Care (Signed)
Immediate Anesthesia Transfer of Care Note  Patient: [place] Haven  Procedure(s) Performed: CATARACT EXTRACTION PHACO AND INTRAOCULAR LENS PLACEMENT (IOC) LEFT DIABETIC 8.71 00:52.8 (Left Eye)  Patient Location: PACU  Anesthesia Type: MAC  Level of Consciousness: awake, alert  and patient cooperative  Airway and Oxygen Therapy: Patient Spontanous Breathing and Patient connected to supplemental oxygen  Post-op Assessment: Post-op Vital signs reviewed, Patient's Cardiovascular Status Stable, Respiratory Function Stable, Patent Airway and No signs of Nausea or vomiting  Post-op Vital Signs: Reviewed and stable  Complications: No complications documented.

## 2019-11-05 NOTE — Anesthesia Postprocedure Evaluation (Signed)
Anesthesia Post Note  Patient: Belinda Patton  Procedure(s) Performed: CATARACT EXTRACTION PHACO AND INTRAOCULAR LENS PLACEMENT (IOC) LEFT DIABETIC 8.71 00:52.8 (Left Eye)     Patient location during evaluation: PACU Anesthesia Type: MAC Level of consciousness: awake Pain management: pain level controlled Vital Signs Assessment: post-procedure vital signs reviewed and stable Respiratory status: respiratory function stable Cardiovascular status: stable Postop Assessment: no apparent nausea or vomiting Anesthetic complications: no   No complications documented.  Veda Canning

## 2019-11-05 NOTE — Anesthesia Preprocedure Evaluation (Signed)
Anesthesia Evaluation  Patient identified by MRN, date of birth, ID band Patient awake    Reviewed: Allergy & Precautions, NPO status   Airway Mallampati: II  TM Distance: >3 FB     Dental   Pulmonary neg pulmonary ROS,    breath sounds clear to auscultation       Cardiovascular hypertension,  Rhythm:Regular Rate:Normal  HLD  09/2018 Echo: EF 50-55%, mod LVH, diast dysfxn, Nl RV fxn, mildly dil LA. No significant valvular dzs.   Neuro/Psych    GI/Hepatic   Endo/Other  diabetes, Type 2  Renal/GU      Musculoskeletal   Abdominal   Peds  Hematology   Anesthesia Other Findings   Reproductive/Obstetrics                             Anesthesia Physical Anesthesia Plan  ASA: III  Anesthesia Plan: MAC   Post-op Pain Management:    Induction: Intravenous  PONV Risk Score and Plan: TIVA, Midazolam and Treatment may vary due to age or medical condition  Airway Management Planned: Natural Airway and Nasal Cannula  Additional Equipment:   Intra-op Plan:   Post-operative Plan:   Informed Consent: I have reviewed the patients History and Physical, chart, labs and discussed the procedure including the risks, benefits and alternatives for the proposed anesthesia with the patient or authorized representative who has indicated his/her understanding and acceptance.       Plan Discussed with: CRNA  Anesthesia Plan Comments:         Anesthesia Quick Evaluation

## 2019-11-05 NOTE — Anesthesia Procedure Notes (Signed)
Procedure Name: MAC Date/Time: 11/05/2019 7:33 AM Performed by: Cameron Ali, CRNA Pre-anesthesia Checklist: Patient identified, Emergency Drugs available, Suction available, Timeout performed and Patient being monitored Patient Re-evaluated:Patient Re-evaluated prior to induction Oxygen Delivery Method: Nasal cannula Placement Confirmation: positive ETCO2

## 2019-11-05 NOTE — H&P (Signed)
All labs reviewed. Abnormal studies sent to patients PCP when indicated.  Previous H&P reviewed, patient examined, there are NO CHANGES.  Belinda Ortego Porfilio10/5/20217:29 AM

## 2019-11-06 ENCOUNTER — Encounter: Payer: Self-pay | Admitting: Ophthalmology

## 2019-11-19 ENCOUNTER — Other Ambulatory Visit: Payer: Self-pay

## 2019-11-19 ENCOUNTER — Encounter: Payer: Self-pay | Admitting: Ophthalmology

## 2019-11-22 ENCOUNTER — Other Ambulatory Visit
Admission: RE | Admit: 2019-11-22 | Discharge: 2019-11-22 | Disposition: A | Discharge status: Home / Self Care | Payer: Medicare Other | Source: Ambulatory Visit | Attending: Ophthalmology | Admitting: Ophthalmology

## 2019-11-22 ENCOUNTER — Other Ambulatory Visit: Payer: Self-pay

## 2019-11-22 DIAGNOSIS — Z01812 Encounter for preprocedural laboratory examination: Secondary | ICD-10-CM | POA: Diagnosis present

## 2019-11-22 DIAGNOSIS — Z20822 Contact with and (suspected) exposure to covid-19: Secondary | ICD-10-CM | POA: Diagnosis not present

## 2019-11-22 NOTE — Anesthesia Preprocedure Evaluation (Addendum)
Anesthesia Evaluation  Patient identified by MRN, date of birth, ID band Patient awake    Reviewed: Allergy & Precautions, NPO status , Patient's Chart, lab work & pertinent test results, reviewed documented beta blocker date and time   History of Anesthesia Complications Negative for: history of anesthetic complications  Airway Mallampati: III  TM Distance: >3 FB Neck ROM: Full    Dental  (+) Upper Dentures, Edentulous Lower   Pulmonary neg pulmonary ROS,    breath sounds clear to auscultation       Cardiovascular hypertension, (-) angina+CHF (Diastolic dysfunction)  (-) DOE + dysrhythmias (LBBB)  Rhythm:Regular Rate:Normal  HLD  09/2018 Echo: EF 50-55%, mod LVH, diast dysfxn, Nl RV fxn, mildly dil LA. No significant valvular dzs.   Neuro/Psych    GI/Hepatic neg GERD  ,  Endo/Other  diabetes, Type 2  Renal/GU      Musculoskeletal   Abdominal (+) + obese (BMI 32),   Peds  Hematology   Anesthesia Other Findings   Reproductive/Obstetrics                            Anesthesia Physical  Anesthesia Plan  ASA: III  Anesthesia Plan: MAC   Post-op Pain Management:    Induction: Intravenous  PONV Risk Score and Plan: 2 and TIVA, Midazolam and Treatment may vary due to age or medical condition  Airway Management Planned: Natural Airway and Nasal Cannula  Additional Equipment:   Intra-op Plan:   Post-operative Plan:   Informed Consent: I have reviewed the patients History and Physical, chart, labs and discussed the procedure including the risks, benefits and alternatives for the proposed anesthesia with the patient or authorized representative who has indicated his/her understanding and acceptance.       Plan Discussed with: CRNA  Anesthesia Plan Comments:         Anesthesia Quick Evaluation

## 2019-11-22 NOTE — Discharge Instructions (Signed)

## 2019-11-23 LAB — SARS CORONAVIRUS 2 (TAT 6-24 HRS): SARS Coronavirus 2: NEGATIVE

## 2019-11-26 ENCOUNTER — Encounter
Admission: RE | Disposition: A | Discharge status: Home / Self Care | Payer: Self-pay | Source: Home / Self Care | Attending: Ophthalmology

## 2019-11-26 ENCOUNTER — Ambulatory Visit: Payer: Medicare Other | Admitting: Anesthesiology

## 2019-11-26 ENCOUNTER — Other Ambulatory Visit: Payer: Self-pay

## 2019-11-26 ENCOUNTER — Ambulatory Visit
Admission: RE | Admit: 2019-11-26 | Discharge: 2019-11-26 | Disposition: A | Discharge status: Home / Self Care | Payer: Medicare Other | Attending: Ophthalmology | Admitting: Ophthalmology

## 2019-11-26 ENCOUNTER — Encounter: Payer: Self-pay | Admitting: Ophthalmology

## 2019-11-26 DIAGNOSIS — E1136 Type 2 diabetes mellitus with diabetic cataract: Secondary | ICD-10-CM | POA: Diagnosis not present

## 2019-11-26 DIAGNOSIS — Z880 Allergy status to penicillin: Secondary | ICD-10-CM | POA: Diagnosis not present

## 2019-11-26 DIAGNOSIS — H2511 Age-related nuclear cataract, right eye: Secondary | ICD-10-CM | POA: Diagnosis present

## 2019-11-26 HISTORY — PX: CATARACT EXTRACTION W/PHACO: SHX586

## 2019-11-26 LAB — GLUCOSE, CAPILLARY
Glucose-Capillary: 130 mg/dL — ABNORMAL HIGH (ref 70–99)
Glucose-Capillary: 102 mg/dL — ABNORMAL HIGH (ref 70–99)

## 2019-11-26 SURGERY — PHACOEMULSIFICATION, CATARACT, WITH IOL INSERTION
Anesthesia: Monitor Anesthesia Care | Site: Eye | Laterality: Right

## 2019-11-26 MED ORDER — FENTANYL CITRATE (PF) 100 MCG/2ML IJ SOLN
INTRAMUSCULAR | Status: DC | PRN
  Administered 2019-11-26: 50 ug via INTRAVENOUS

## 2019-11-26 MED ORDER — MOXIFLOXACIN HCL 0.5 % OP SOLN
OPHTHALMIC | Status: DC | PRN
  Administered 2019-11-26: 0.2 mL via OPHTHALMIC

## 2019-11-26 MED ORDER — EPINEPHRINE PF 1 MG/ML IJ SOLN
INTRAOCULAR | Status: DC | PRN
  Administered 2019-11-26: 98 mL via OPHTHALMIC

## 2019-11-26 MED ORDER — BRIMONIDINE TARTRATE-TIMOLOL 0.2-0.5 % OP SOLN
OPHTHALMIC | Status: DC | PRN
  Administered 2019-11-26: 1 [drp] via OPHTHALMIC

## 2019-11-26 MED ORDER — LACTATED RINGERS IV SOLN: INTRAVENOUS | Status: DC

## 2019-11-26 MED ORDER — TETRACAINE HCL 0.5 % OP SOLN
1.0000 [drp] | OPHTHALMIC | Status: DC | PRN
  Administered 2019-11-26 (×3): 1 [drp] via OPHTHALMIC

## 2019-11-26 MED ORDER — NA CHONDROIT SULF-NA HYALURON 40-17 MG/ML IO SOLN
INTRAOCULAR | Status: DC | PRN
  Administered 2019-11-26: 1 mL via INTRAOCULAR

## 2019-11-26 MED ORDER — LIDOCAINE HCL (PF) 2 % IJ SOLN
INTRAOCULAR | Status: DC | PRN
  Administered 2019-11-26: 1 mL

## 2019-11-26 MED ORDER — MIDAZOLAM HCL 2 MG/2ML IJ SOLN
INTRAMUSCULAR | Status: DC | PRN
  Administered 2019-11-26: 1 mg via INTRAVENOUS

## 2019-11-26 MED ORDER — ARMC OPHTHALMIC DILATING DROPS
1.0000 "application " | OPHTHALMIC | Status: DC | PRN
  Administered 2019-11-26 (×3): 1 via OPHTHALMIC

## 2019-11-26 SURGICAL SUPPLY — 19 items
CANNULA ANT/CHMB 27GA (MISCELLANEOUS) ×4 IMPLANT
GLOVE SURG LX 8.0 MICRO (GLOVE) ×2
GLOVE SURG LX STRL 8.0 MICRO (GLOVE) ×2 IMPLANT
GLOVE SURG TRIUMPH 8.0 PF LTX (GLOVE) ×2 IMPLANT
GOWN STRL REUS W/ TWL LRG LVL3 (GOWN DISPOSABLE) ×2 IMPLANT
GOWN STRL REUS W/TWL LRG LVL3 (GOWN DISPOSABLE) ×4
LENS IOL TECNIS EYHANCE 19.5 (Intraocular Lens) ×2 IMPLANT
MARKER SKIN DUAL TIP RULER LAB (MISCELLANEOUS) ×2 IMPLANT
NEEDLE FILTER BLUNT 18X 1/2SAF (NEEDLE) ×1
NEEDLE FILTER BLUNT 18X1 1/2 (NEEDLE) ×1 IMPLANT
PACK EYE AFTER SURG (MISCELLANEOUS) ×2 IMPLANT
PACK OPTHALMIC (MISCELLANEOUS) ×2 IMPLANT
PACK PORFILIO (MISCELLANEOUS) ×2 IMPLANT
SUT ETHILON 10-0 CS-B-6CS-B-6 (SUTURE)
SUTURE EHLN 10-0 CS-B-6CS-B-6 (SUTURE) IMPLANT
SYR 3ML LL SCALE MARK (SYRINGE) ×2 IMPLANT
SYR TB 1ML LUER SLIP (SYRINGE) ×2 IMPLANT
WATER STERILE IRR 250ML POUR (IV SOLUTION) ×2 IMPLANT
WIPE NON LINTING 3.25X3.25 (MISCELLANEOUS) ×2 IMPLANT

## 2019-11-26 NOTE — Op Note (Signed)
PREOPERATIVE DIAGNOSIS:  Nuclear sclerotic cataract of the right eye.   POSTOPERATIVE DIAGNOSIS:  Cataract   OPERATIVE PROCEDURE:@   SURGEON:  Galen Manila, MD.   ANESTHESIA:  Anesthesiologist: Heniser, Burman Foster, MD CRNA: Jinny Blossom, CRNA; Maree Krabbe, CRNA  1.      Managed anesthesia care. 2.      0.86ml of Shugarcaine was instilled in the eye following the paracentesis.   COMPLICATIONS:  None.   TECHNIQUE:   Stop and chop   DESCRIPTION OF PROCEDURE:  The patient was examined and consented in the preoperative holding area where the aforementioned topical anesthesia was applied to the right eye and then brought back to the Operating Room where the right eye was prepped and draped in the usual sterile ophthalmic fashion and a lid speculum was placed. A paracentesis was created with the side port blade and the anterior chamber was filled with viscoelastic. A near clear corneal incision was performed with the steel keratome. A continuous curvilinear capsulorrhexis was performed with a cystotome followed by the capsulorrhexis forceps. Hydrodissection and hydrodelineation were carried out with BSS on a blunt cannula. The lens was removed in a stop and chop  technique and the remaining cortical material was removed with the irrigation-aspiration handpiece. The capsular bag was inflated with viscoelastic and the Technis [id number]  lens was placed in the capsular bag without complication. The remaining viscoelastic was removed from the eye with the irrigation-aspiration handpiece. The wounds were hydrated. The anterior chamber was flushed with BSS and the eye was inflated to physiologic pressure. 0.61ml of Vigamox was placed in the anterior chamber. The wounds were found to be water tight. The eye was dressed with Combigan. The patient was given protective glasses to wear throughout the day and a shield with which to sleep tonight. The patient was also given drops with which to begin a drop  regimen today and will follow-up with me in one day. Implant Name Type Inv. Item Serial No. Manufacturer Lot No. LRB No. Used Action  LENS IOL TECNIS EYHANCE 19.5 - [id number] Intraocular Lens LENS IOL TECNIS EYHANCE 19.5 [id number] JOHNSON   Right 1 Implanted   Procedure(s) with comments: CATARACT EXTRACTION PHACO AND INTRAOCULAR LENS PLACEMENT (IOC) RIGHT DIABETIC (Right) - 8.89 1:17.1  Electronically signed: Galen Manila 11/26/2019 11:26 AM

## 2019-11-26 NOTE — Anesthesia Procedure Notes (Signed)
Procedure Name: MAC Date/Time: 11/26/2019 11:04 AM Performed by: Cameron Ali, CRNA Pre-anesthesia Checklist: Patient identified, Emergency Drugs available, Suction available, Timeout performed and Patient being monitored Patient Re-evaluated:Patient Re-evaluated prior to induction Oxygen Delivery Method: Nasal cannula Placement Confirmation: positive ETCO2

## 2019-11-26 NOTE — H&P (Signed)
[facility]   Primary Care Physician:  [facility] Ophthalmologist: Dr. Druscilla Brownie  Pre-Procedure History & Physical: HPI:  Belinda Patton is a 79 y.o. female here for cataract surgery.   Past Medical History:  Diagnosis Date  . Diastolic dysfunction    a. 09/2018 Echo: EF 50-55%, mod LVH, diast dysfxn, Nl RV fxn, mildly dil LA. No significant valvular dzs.  . Essential hypertension   . Hyperlipidemia   . Irregular heart beat   . LBBB (left bundle branch block)   . Type 2 diabetes mellitus (HCC)   . Wears dentures    partial upper and lower    Past Surgical History:  Procedure Laterality Date  . CATARACT EXTRACTION W/PHACO Left 11/05/2019   Procedure: CATARACT EXTRACTION PHACO AND INTRAOCULAR LENS PLACEMENT (IOC) LEFT DIABETIC 8.71 00:52.8;  Surgeon: Galen Manila, MD;  Location: [facility];  Service: Ophthalmology;  Laterality: Left;  Diabetic - oral meds  . KNEE ARTHROSCOPY Right     Prior to Admission medications   Medication Sig Start Date End Date Taking? Authorizing Provider  ASPIRIN 81 PO Take 81 mg by mouth daily. 04/03/19  Yes [provider]  atorvastatin (LIPITOR) 40 MG tablet Take 40 mg by mouth at bedtime. 04/03/19  Yes [provider]  lisinopril (ZESTRIL) 2.5 MG tablet Take 2.5 mg by mouth daily.   Yes [provider]  metFORMIN (GLUCOPHAGE) 500 MG tablet Take 500 mg by mouth 2 (two) times daily with a meal.    Yes [provider]  metoprolol succinate (TOPROL-XL) 100 MG 24 hr tablet Take 100 mg by mouth daily. Take with or immediately following a meal.   Yes [provider]  Multiple Vitamin (MULTIVITAMIN) tablet Take 1 tablet by mouth daily.   Yes [provider]  oxybutynin (DITROPAN-XL) 5 MG 24 hr tablet Take 5 mg by mouth daily.   Yes [provider]  spironolactone (ALDACTONE) 25 MG tablet Take 25 mg by mouth daily.   Yes [provider]  Vitamin D3 (VITAMIN  D) 25 MCG tablet Take 1,000 Units by mouth daily.   Yes [provider]    Allergies as of 11/07/2019 - Review Complete 11/05/2019  Allergen Reaction Noted  . Bee venom  08/29/2018  . Penicillins Itching 03/29/2017    Family History  Problem Relation Age of Onset  . Hypertension Mother   . COPD Father   . Lung cancer Father   . Diabetes Paternal Grandfather   . Heart Problems Son   . Breast cancer Neg Hx     Social History   Socioeconomic History  . Marital status: Widowed    Spouse name: Not on file  . Number of children: Not on file  . Years of education: Not on file  . Highest education level: Not on file  Occupational History  . Not on file  Tobacco Use  . Smoking status: Never Smoker  . Smokeless tobacco: Never Used  Vaping Use  . Vaping Use: Never used  Substance and Sexual Activity  . Alcohol use: Yes    Comment: during the holidays   . Drug use: Not Currently  . Sexual activity: Not on file  Other Topics Concern  . Not on file  Social History Narrative   Widower.   Retired. 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   Enjoys spending time with grandchildren, gardening.   Social Determinants of Health   Financial Resource Strain:   .  Difficulty of Paying Living Expenses: Not on file  Food Insecurity:   . Worried About Programme researcher, broadcasting/film/video in the Last Year: Not on file  . Ran Out of Food in the Last Year: Not on file  Transportation Needs:   . Lack of Transportation (Medical): Not on file  . Lack of Transportation (Non-Medical): Not on file  Physical Activity:   . Days of Exercise per Week: Not on file  . Minutes of Exercise per Session: Not on file  Stress:   . Feeling of Stress : Not on file  Social Connections:   . Frequency of Communication with Friends and Family: Not on file  . Frequency of Social Gatherings with Friends and Family: Not on file  . Attends Religious Services: Not on file  . Active Member of  Clubs or Organizations: Not on file  . Attends Banker Meetings: Not on file  . Marital Status: Not on file  Intimate Partner Violence:   . Fear of Current or Ex-Partner: Not on file  . Emotionally Abused: Not on file  . Physically Abused: Not on file  . Sexually Abused: Not on file    Review of Systems: See HPI, otherwise negative ROS  Physical Exam: BP (!) 132/57   Pulse (!) 51   Temp (!) 97 F (36.1 C) (Temporal)   Ht 5\' 8"  (1.727 m)   Wt 94.3 kg   SpO2 100%   BMI 31.63 kg/m  General:   Alert,  pleasant and cooperative in NAD Head:  Normocephalic and atraumatic. Lungs:  Clear to auscultation.    Heart:  Regular rate and rhythm.   Impression/Plan: is here for cataract surgery.  Risks, benefits, limitations, and alternatives regarding cataract surgery have been reviewed with the patient.  Questions have been answered.  All parties agreeable.   [name], MD  11/26/2019, 10:56 AM

## 2019-11-26 NOTE — Transfer of Care (Signed)
Immediate Anesthesia Transfer of Care Note  Patient: [place]  Procedure(s) Performed: CATARACT EXTRACTION PHACO AND INTRAOCULAR LENS PLACEMENT (IOC) RIGHT DIABETIC (Right Eye)  Patient Location: PACU  Anesthesia Type: MAC  Level of Consciousness: awake, alert  and patient cooperative  Airway and Oxygen Therapy: Patient Spontanous Breathing and Patient connected to supplemental oxygen  Post-op Assessment: Post-op Vital signs reviewed, Patient's Cardiovascular Status Stable, Respiratory Function Stable, Patent Airway and No signs of Nausea or vomiting  Post-op Vital Signs: Reviewed and stable  Complications: No complications documented.

## 2019-11-26 NOTE — Anesthesia Postprocedure Evaluation (Signed)
Anesthesia Post Note  Patient: Belinda Patton  Procedure(s) Performed: CATARACT EXTRACTION PHACO AND INTRAOCULAR LENS PLACEMENT (IOC) RIGHT DIABETIC (Right Eye)     Patient location during evaluation: PACU Anesthesia Type: MAC Level of consciousness: awake and alert Pain management: pain level controlled Vital Signs Assessment: post-procedure vital signs reviewed and stable Respiratory status: spontaneous breathing, nonlabored ventilation, respiratory function stable and patient connected to nasal cannula oxygen Cardiovascular status: stable and blood pressure returned to baseline Postop Assessment: no apparent nausea or vomiting Anesthetic complications: no   No complications documented.  Verlinda Slotnick A  Jodene Polyak

## 2019-11-27 ENCOUNTER — Encounter: Payer: Self-pay | Admitting: Ophthalmology

## 2019-12-02 ENCOUNTER — Ambulatory Visit: Payer: Medicare Other | Admitting: Cardiology

## 2019-12-03 ENCOUNTER — Encounter: Payer: Self-pay | Admitting: Cardiology

## 2020-02-11 ENCOUNTER — Encounter: Payer: Self-pay | Admitting: Cardiology

## 2020-02-17 ENCOUNTER — Telehealth: Payer: Self-pay | Admitting: Cardiology

## 2020-02-17 NOTE — Telephone Encounter (Signed)
  Patient Consent for Virtual Visit         Belinda Patton has provided verbal consent on 02/17/2020 for a virtual visit (video or telephone).   CONSENT FOR VIRTUAL VISIT FOR:  [name]  By participating in this virtual visit I agree to the following:  I hereby voluntarily request, consent and authorize [facility] and its employed or contracted physicians, physician assistants, nurse practitioners or other licensed health care professionals (the Practitioner), to provide me with telemedicine health care services (the "Services") as deemed necessary by the treating Practitioner. I acknowledge and consent to receive the Services by the Practitioner via telemedicine. I understand that the telemedicine visit will involve communicating with the Practitioner through live audiovisual communication technology and the disclosure of certain medical information by electronic transmission. I acknowledge that I have been given the opportunity to request an in-person assessment or other available alternative prior to the telemedicine visit and am voluntarily participating in the telemedicine visit.  I understand that I have the right to withhold or withdraw my consent to the use of telemedicine in the course of my care at any time, without affecting my right to future care or treatment, and that the Practitioner or I may terminate the telemedicine visit at any time. I understand that I have the right to inspect all information obtained and/or recorded in the course of the telemedicine visit and may receive copies of available information for a reasonable fee.  I understand that some of the potential risks of receiving the Services via telemedicine include:  Marland Kitchen Delay or interruption in medical evaluation due to technological equipment failure or disruption; . Information transmitted may not be sufficient (e.g. poor resolution of images) to allow for appropriate medical decision making by the Practitioner;  and/or  . In rare instances, security protocols could fail, causing a breach of personal health information.  Furthermore, I acknowledge that it is my responsibility to provide information about my medical history, conditions and care that is complete and accurate to the best of my ability. I acknowledge that Practitioner's advice, recommendations, and/or decision may be based on factors not within their control, such as incomplete or inaccurate data provided by me or distortions of diagnostic images or specimens that may result from electronic transmissions. I understand that the practice of medicine is not an exact science and that Practitioner makes no warranties or guarantees regarding treatment outcomes. I acknowledge that a copy of this consent can be made available to me via my patient portal [facility] MyChart), or I can request a printed copy by calling the office of [facility].    I understand that my insurance will be billed for this visit.   I have read or had this consent read to me. . I understand the contents of this consent, which adequately explains the benefits and risks of the Services being provided via telemedicine.  . I have been provided ample opportunity to ask questions regarding this consent and the Services and have had my questions answered to my satisfaction. . I give my informed consent for the services to be provided through the use of telemedicine in my medical care

## 2020-02-18 ENCOUNTER — Encounter: Payer: Self-pay | Admitting: Cardiology

## 2020-02-18 ENCOUNTER — Telehealth (INDEPENDENT_AMBULATORY_CARE_PROVIDER_SITE_OTHER): Payer: Medicare Other | Admitting: Cardiology

## 2020-02-18 ENCOUNTER — Other Ambulatory Visit: Payer: Self-pay

## 2020-02-18 VITALS — Ht 69.0 in | Wt 214.0 lb

## 2020-02-18 DIAGNOSIS — R0600 Dyspnea, unspecified: Secondary | ICD-10-CM | POA: Diagnosis not present

## 2020-02-18 DIAGNOSIS — E7800 Pure hypercholesterolemia, unspecified: Secondary | ICD-10-CM | POA: Diagnosis not present

## 2020-02-18 DIAGNOSIS — I10 Essential (primary) hypertension: Secondary | ICD-10-CM

## 2020-02-18 DIAGNOSIS — R0609 Other forms of dyspnea: Secondary | ICD-10-CM

## 2020-02-18 NOTE — Progress Notes (Signed)
Virtual Visit via Telephone Note   This visit type was conducted due to national recommendations for restrictions regarding the COVID-19 Pandemic (e.g. social distancing) in an effort to limit this patient's exposure and mitigate transmission in our community.  Due to her co-morbid illnesses, this patient is at least at moderate risk for complications without adequate follow up.  This format is felt to be most appropriate for this patient at this time.  The patient did not have access to video technology/had technical difficulties with video requiring transitioning to audio format only (telephone).  All issues noted in this document were discussed and addressed.  No physical exam could be performed with this format.  Please refer to the patient's chart for her  consent to telehealth for [facility].   Date:  02/18/2020   ID:  Belinda Patton, DOB [id number], MRN [id number]  Patient Location: Home Provider Location: Office/Clinic  PCP:  [facility]  Cardiologist:  Debbe Odea, MD  Electrophysiologist:  None   Evaluation Performed:  Follow-Up Visit  Chief Complaint: Shortness of breath  History of Present Illness:    Belinda Patton is a 80 y.o. female with hypertension, hyperlipidemia, diabetes presenting for follow-up.  Previously seen by Dr. Cristal Deer back in 2020 due to shortness of breath .   At last clinic visit on 05/2019, patient still had shortness of breath which she attributed to deconditioning.  Plan was to increase graduated exercising, and if symptoms improve, pursue additional test such as stress testing.  She states her symptoms have improved since her last clinic visit.  She has also lost a little bit of weight which she thinks might of helped.  She feels well, blood pressures controlled when measured at home.  Has no concerns at this time.  Prior notes Echocardiogram 09/2018 normal systolic function, impaired relaxation, EF 50 to 55%, moderate LVH.    The patient does not have symptoms concerning for COVID-19 infection (fever, chills, cough, or new shortness of breath).    Past Medical History:  Diagnosis Date  . Diastolic dysfunction    a. 09/2018 Echo: EF 50-55%, mod LVH, diast dysfxn, Nl RV fxn, mildly dil LA. No significant valvular dzs.  . Essential hypertension   . Hyperlipidemia   . Irregular heart beat   . LBBB (left bundle branch block)   . Type 2 diabetes mellitus (HCC)   . Wears dentures    partial upper and lower   Past Surgical History:  Procedure Laterality Date  . CATARACT EXTRACTION W/PHACO Left 11/05/2019   Procedure: CATARACT EXTRACTION PHACO AND INTRAOCULAR LENS PLACEMENT (IOC) LEFT DIABETIC 8.71 00:52.8;  Surgeon: Galen Manila, MD;  Location: [facility];  Service: Ophthalmology;  Laterality: Left;  Diabetic - oral meds  . CATARACT EXTRACTION W/PHACO Right 11/26/2019   Procedure: CATARACT EXTRACTION PHACO AND INTRAOCULAR LENS PLACEMENT (IOC) RIGHT DIABETIC;  Surgeon: Galen Manila, MD;  Location: [facility];  Service: Ophthalmology;  Laterality: Right;  8.89 1:17.1  . KNEE ARTHROSCOPY Right      Current Meds  Medication Sig  . ASPIRIN 81 PO Take 81 mg by mouth daily.  Marland Kitchen atorvastatin (LIPITOR) 40 MG tablet Take 40 mg by mouth at bedtime.  Marland Kitchen lisinopril (ZESTRIL) 2.5 MG tablet Take 2.5 mg by mouth daily.  . metFORMIN (GLUCOPHAGE) 500 MG tablet Take 500 mg by mouth 2 (two) times daily with a meal.   . metoprolol succinate (TOPROL-XL) 50 MG 24 hr tablet Take 50 mg by mouth  daily.  . Multiple Vitamin (MULTIVITAMIN) tablet Take 1 tablet by mouth daily.  Marland Kitchen oxybutynin (DITROPAN-XL) 5 MG 24 hr tablet Take 5 mg by mouth daily.  Marland Kitchen spironolactone (ALDACTONE) 25 MG tablet Take 12.5 mg by mouth daily.  . Vitamin D3 (VITAMIN D) 25 MCG tablet Take 1,000 Units by mouth daily.     Allergies:   Bee venom and Penicillins   Social History   Tobacco Use  . Smoking status: Never Smoker  .  Smokeless tobacco: Never Used  Vaping Use  . Vaping Use: Never used  Substance Use Topics  . Alcohol use: Yes    Comment: during the holidays   . Drug use: Not Currently     Family Hx: The patient's family history includes COPD in her father; Diabetes in her paternal grandfather; Heart Problems in her son; Hypertension in her mother; Lung cancer in her father. There is no history of Breast cancer.  ROS:   Please see the history of present illness.     All other systems reviewed and are negative.   Prior CV studies:   The following studies were reviewed today:    Labs/Other Tests and Data Reviewed:    EKG:  No ECG reviewed.  Recent Labs: No results found for requested labs within last 8760 hours.   Recent Lipid Panel Lab Results  Component Value Date/Time   CHOL 132 03/31/2017 08:57 AM   TRIG 57.0 03/31/2017 08:57 AM   HDL 67.70 03/31/2017 08:57 AM   CHOLHDL 2 03/31/2017 08:57 AM   LDLCALC 52 03/31/2017 08:57 AM    Wt Readings from Last 3 Encounters:  02/18/20 214 lb (97.1 kg)  11/26/19 208 lb (94.3 kg)  11/05/19 206 lb (93.4 kg)     Objective:    Vital Signs:  Ht 5\' 9"  (1.753 m)   Wt 214 lb (97.1 kg)   BMI 31.60 kg/m    VITAL SIGNS:  reviewed GEN:  no acute distress  ASSESSMENT & PLAN:    1. Dyspnea on exertion, improving with graduated exercising.  Last echo with preserved ejection fraction.  Patient encouraged to continue exercising.  We will hold off on stress testing since her symptoms are improving. 2. Hypertension.  Continue current BP meds. 3. Hyperlipidemia, continue Lipitor  Follow-up in 6 months.  COVID-19 Education: The signs and symptoms of COVID-19 were discussed with the patient and how to seek care for testing (follow up with PCP or arrange E-visit). The importance of social distancing was discussed today.  Time:   Today, I have spent 35 minutes with the patient with telehealth technology discussing the above problems.      Medication Adjustments/Labs and Tests Ordered: Current medicines are reviewed at length with the patient today.  Concerns regarding medicines are outlined above.   Tests Ordered: No orders of the defined types were placed in this encounter.   Medication Changes: No orders of the defined types were placed in this encounter.   Follow Up:  In Person in 6 month(s)  Signed, , MD  02/18/2020 3:28 PM    [facility]

## 2020-02-18 NOTE — Patient Instructions (Signed)
Medication Instructions:  Your physician recommends that you continue on your current medications as directed. Please refer to the Current Medication list given to you today.  *If you need a refill on your cardiac medications before your next appointment, please call your pharmacy*  Follow-Up: At [facility], you and your health needs are our priority.  As part of our continuing mission to provide you with exceptional heart care, we have created designated Provider Care Teams.  These Care Teams include your primary Cardiologist (physician) and Advanced Practice Providers (APPs -  Physician Assistants and Nurse Practitioners) who all work together to provide you with the care you need, when you need it.  We recommend signing up for the patient portal called "MyChart".  Sign up information is provided on this After Visit Summary.  MyChart is used to connect with patients for Virtual Visits (Telemedicine).  Patients are able to view lab/test results, encounter notes, upcoming appointments, etc.  Non-urgent messages can be sent to your provider as well.   To learn more about what you can do with MyChart, go to [email or web address].com.    Your next appointment:   6 month(s)  The format for your next appointment:   In Person  Provider:   You may see Brian Agbor-Etang, MD or one of the following Advanced Practice Providers on your designated Care Team:    Christopher Berge, NP  Ryan Dunn, PA-C  Jacquelyn Visser, PA-C  Cadence Furth, PA-C  Caitlin Walker, NP  

## 2020-02-24 ENCOUNTER — Encounter (INDEPENDENT_AMBULATORY_CARE_PROVIDER_SITE_OTHER): Payer: Self-pay | Admitting: Vascular Surgery

## 2020-02-24 ENCOUNTER — Ambulatory Visit (INDEPENDENT_AMBULATORY_CARE_PROVIDER_SITE_OTHER): Payer: Medicare Other | Admitting: Vascular Surgery

## 2020-02-24 ENCOUNTER — Other Ambulatory Visit: Payer: Self-pay

## 2020-02-24 VITALS — BP 146/75 | HR 79 | Resp 16 | Ht 69.0 in | Wt 213.0 lb

## 2020-02-24 DIAGNOSIS — E119 Type 2 diabetes mellitus without complications: Secondary | ICD-10-CM

## 2020-02-24 DIAGNOSIS — E785 Hyperlipidemia, unspecified: Secondary | ICD-10-CM

## 2020-02-24 DIAGNOSIS — I10 Essential (primary) hypertension: Secondary | ICD-10-CM

## 2020-02-24 DIAGNOSIS — I890 Lymphedema, not elsewhere classified: Secondary | ICD-10-CM | POA: Diagnosis not present

## 2020-02-24 DIAGNOSIS — I872 Venous insufficiency (chronic) (peripheral): Secondary | ICD-10-CM

## 2020-02-28 ENCOUNTER — Encounter: Payer: Self-pay | Admitting: Cardiology

## 2020-02-28 ENCOUNTER — Ambulatory Visit (INDEPENDENT_AMBULATORY_CARE_PROVIDER_SITE_OTHER): Payer: Medicare Other | Admitting: Cardiology

## 2020-02-28 ENCOUNTER — Other Ambulatory Visit: Payer: Self-pay

## 2020-02-28 VITALS — BP 122/76 | HR 69 | Ht 69.0 in | Wt 215.0 lb

## 2020-02-28 DIAGNOSIS — I10 Essential (primary) hypertension: Secondary | ICD-10-CM

## 2020-02-28 DIAGNOSIS — R0600 Dyspnea, unspecified: Secondary | ICD-10-CM

## 2020-02-28 DIAGNOSIS — R0609 Other forms of dyspnea: Secondary | ICD-10-CM

## 2020-02-28 DIAGNOSIS — E7800 Pure hypercholesterolemia, unspecified: Secondary | ICD-10-CM | POA: Diagnosis not present

## 2020-02-28 DIAGNOSIS — N183 Chronic kidney disease, stage 3 unspecified: Secondary | ICD-10-CM

## 2020-02-28 NOTE — Progress Notes (Signed)
Cardiology Office Note:    Date:  02/28/2020   ID:  Belinda Patton, DOB 1940-10-05, MRN [id number]  PCP:  [facility] Cardiologist:  Debbe Odea, MD  [facility] Electrophysiologist:  None   Referring MD: [facility]   Chief Complaint  Patient presents with  . Follow-up    Medication changes  PCP stopped Spironolactone after having blood work done.    History of Present Illness:    Belinda Patton is a 80 y.o. female with a hx of hypertension, hyperlipidemia, diabetes who presents for follow-up.  Last seen via telemedicine for shortness of breath attributed to deconditioning. Symptoms improved with exercising and some weight loss.  Had a recent lab work performed by primary care physician in which her potassium was trending upwards, creatinine also worsened.  Aldactone was stopped.  As a diagnosis of CKD 3, patient has not establish care with nephrology.  She is interested in doing so.  Prior notes Echocardiogram 09/2018 normal systolic function, impaired relaxation, EF 50 to 55%, moderate LVH.   Past Medical History:  Diagnosis Date  . Diastolic dysfunction    a. 09/2018 Echo: EF 50-55%, mod LVH, diast dysfxn, Nl RV fxn, mildly dil LA. No significant valvular dzs.  . Essential hypertension   . Hyperlipidemia   . Irregular heart beat   . LBBB (left bundle branch block)   . Type 2 diabetes mellitus (HCC)   . Wears dentures    partial upper and lower    Past Surgical History:  Procedure Laterality Date  . CATARACT EXTRACTION W/PHACO Left 11/05/2019   Procedure: CATARACT EXTRACTION PHACO AND INTRAOCULAR LENS PLACEMENT (IOC) LEFT DIABETIC 8.71 00:52.8;  Surgeon: Belinda Manila, MD;  Location: [facility];  Service: Ophthalmology;  Laterality: Left;  Diabetic - oral meds  . CATARACT EXTRACTION W/PHACO Right 11/26/2019   Procedure: CATARACT EXTRACTION PHACO AND INTRAOCULAR LENS PLACEMENT (IOC) RIGHT DIABETIC;   Surgeon: Belinda Manila, MD;  Location: [facility];  Service: Ophthalmology;  Laterality: Right;  8.89 1:17.1  . KNEE ARTHROSCOPY Right     Current Medications: Current Meds  Medication Sig  . ASPIRIN 81 PO Take 81 mg by mouth daily.  Marland Kitchen atorvastatin (LIPITOR) 40 MG tablet Take 40 mg by mouth at bedtime.  Marland Kitchen lisinopril (ZESTRIL) 2.5 MG tablet Take 2.5 mg by mouth daily.  . metFORMIN (GLUCOPHAGE) 500 MG tablet Take 500 mg by mouth 2 (two) times daily with a meal.   . metoprolol succinate (TOPROL-XL) 50 MG 24 hr tablet Take 50 mg by mouth daily.  . Multiple Vitamin (MULTIVITAMIN) tablet Take 1 tablet by mouth daily.  Marland Kitchen oxybutynin (DITROPAN-XL) 5 MG 24 hr tablet Take 5 mg by mouth daily.  . Vitamin D3 (VITAMIN D) 25 MCG tablet Take 1,000 Units by mouth daily.     Allergies:   Bee venom and Penicillins   Social History   Socioeconomic History  . Marital status: Widowed    Spouse name: Not on file  . Number of children: Not on file  . Years of education: Not on file  . Highest education level: Not on file  Occupational History  . Not on file  Tobacco Use  . Smoking status: Never Smoker  . Smokeless tobacco: Never Used  Vaping Use  . Vaping Use: Never used  Substance and Sexual Activity  . Alcohol use: Yes    Comment: during the holidays   . Drug use: Not Currently  . Sexual  activity: Not on file  Other Topics Concern  . Not on file  Social History Narrative   Widower.   Retired. 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   Enjoys spending time with grandchildren, gardening.   Social Determinants of Health   Financial Resource Strain: Not on file  Food Insecurity: Not on file  Transportation Needs: Not on file  Physical Activity: Not on file  Stress: Not on file  Social Connections: Not on file     Family History: The patient's family history includes COPD in her father; Diabetes in her paternal grandfather; Heart Problems in her  son; Hypertension in her mother; Lung cancer in her father. There is no history of Breast cancer.  ROS:   Please see the history of present illness.     All other systems reviewed and are negative.  EKGs/Labs/Other Studies Reviewed:    The following studies were reviewed today:   EKG:  EKG is  ordered today.  The ekg ordered today demonstrates normal sinus rhythm, left bundle branch block.  Recent Labs: No results found for requested labs within last 8760 hours.  Recent Lipid Panel    Component Value Date/Time   CHOL 132 03/31/2017 0857   TRIG 57.0 03/31/2017 0857   HDL 67.70 03/31/2017 0857   CHOLHDL 2 03/31/2017 0857   VLDL 11.4 03/31/2017 0857   LDLCALC 52 03/31/2017 0857     Risk Assessment/Calculations:      Physical Exam:    VS:  BP 122/76   Pulse 69   Ht 5\' 9"  (1.753 m)   Wt 215 lb (97.5 kg)   BMI 31.75 kg/m     Wt Readings from Last 3 Encounters:  02/28/20 215 lb (97.5 kg)  02/24/20 213 lb (96.6 kg)  02/18/20 214 lb (97.1 kg)     GEN:  Well nourished, well developed in no acute distress HEENT: Normal NECK: No JVD; No carotid bruits LYMPHATICS: No lymphadenopathy CARDIAC: RRR, no murmurs, rubs, gallops RESPIRATORY:  Clear to auscultation without rales, wheezing or rhonchi  ABDOMEN: Soft, non-tender, non-distended MUSCULOSKELETAL:  No edema; No deformity  SKIN: Warm and dry NEUROLOGIC:  Alert and oriented x 3 PSYCHIATRIC:  Normal affect   ASSESSMENT:    1. Dyspnea on exertion   2. Primary hypertension   3. Pure hypercholesterolemia   4. Stage 3 chronic kidney disease, unspecified whether stage 3a or 3b CKD (HCC)    PLAN:    In order of problems listed above:  1. Dyspnea on exertion, improving with graduated exercising.  Last echo with preserved ejection fraction.  Patient encouraged to continue exercising.  2. Hypertension.  Okay with stopping Aldactone due to worsening creatinine.  BP controlled, continue lisinopril as patient is a diabetic  and also has CKD.. 3. Hyperlipidemia, continue Lipitor 4. CKD 3, continue lisinopril as above.  Will refer to nephrology to establish care.  Follow-up in 6 months.   Medication Adjustments/Labs and Tests Ordered: Current medicines are reviewed at length with the patient today.  Concerns regarding medicines are outlined above.  Orders Placed This Encounter  Procedures  . Ambulatory referral to Nephrology  . EKG 12-Lead   No orders of the defined types were placed in this encounter.   Patient Instructions  Medication Instructions:  Your physician recommends that you continue on your current medications as directed. Please refer to the Current Medication list given to you today.  *If you need a refill on your cardiac medications before  your next appointment, please call your pharmacy*   Lab Work: None Ordered If you have labs (blood work) drawn today and your tests are completely normal, you will receive your results only by: Marland Kitchen MyChart Message (if you have MyChart) OR . A paper copy in the mail If you have any lab test that is abnormal or we need to change your treatment, we will call you to review the results.   Testing/Procedures: None Ordered   Follow-Up: At [facility], you and your health needs are our priority.  As part of our continuing mission to provide you with exceptional heart care, we have created designated Provider Care Teams.  These Care Teams include your primary Cardiologist (physician) and Advanced Practice Providers (APPs -  Physician Assistants and Nurse Practitioners) who all work together to provide you with the care you need, when you need it.  We recommend signing up for the patient portal called "MyChart".  Sign up information is provided on this After Visit Summary.  MyChart is used to connect with patients for Virtual Visits (Telemedicine).  Patients are able to view lab/test results, encounter notes, upcoming appointments, etc.  Non-urgent messages  can be sent to your provider as well.   To learn more about what you can do with MyChart, go to [email or web address].    Your next appointment:   6 month(s)  The format for your next appointment:   In Person  Provider:   Debbe Odea, MD   Other Instructions  [facility] Phone: [phone]     Signed, Debbe Odea, MD  02/28/2020 12:41 PM    [facility]

## 2020-02-28 NOTE — Patient Instructions (Signed)
Medication Instructions:  Your physician recommends that you continue on your current medications as directed. Please refer to the Current Medication list given to you today.  *If you need a refill on your cardiac medications before your next appointment, please call your pharmacy*   Lab Work: None Ordered If you have labs (blood work) drawn today and your tests are completely normal, you will receive your results only by: Marland Kitchen MyChart Message (if you have MyChart) OR . A paper copy in the mail If you have any lab test that is abnormal or we need to change your treatment, we will call you to review the results.   Testing/Procedures: None Ordered   Follow-Up: At [facility], you and your health needs are our priority.  As part of our continuing mission to provide you with exceptional heart care, we have created designated Provider Care Teams.  These Care Teams include your primary Cardiologist (physician) and Advanced Practice Providers (APPs -  Physician Assistants and Nurse Practitioners) who all work together to provide you with the care you need, when you need it.  We recommend signing up for the patient portal called "MyChart".  Sign up information is provided on this After Visit Summary.  MyChart is used to connect with patients for Virtual Visits (Telemedicine).  Patients are able to view lab/test results, encounter notes, upcoming appointments, etc.  Non-urgent messages can be sent to your provider as well.   To learn more about what you can do with MyChart, go to [email or web address].    Your next appointment:   6 month(s)  The format for your next appointment:   In Person  Provider:   Debbe Odea, MD   Other Instructions  [facility] Phone: [phone]

## 2020-03-07 DIAGNOSIS — I872 Venous insufficiency (chronic) (peripheral): Secondary | ICD-10-CM | POA: Insufficient documentation

## 2020-03-07 DIAGNOSIS — I890 Lymphedema, not elsewhere classified: Secondary | ICD-10-CM | POA: Insufficient documentation

## 2020-03-07 NOTE — Progress Notes (Signed)
MRN : [id number]  Belinda Patton is a 80 y.o. (Mar 21, 1940) female who presents with chief complaint of  Chief Complaint  Patient presents with  . New Patient (Initial Visit)    Ref Crawford vascular disease  .  History of Present Illness:   Patient is seen for evaluation of leg pain and leg swelling. The patient first noticed the swelling remotely. The swelling is associated with pain and discoloration. The pain and swelling worsens with prolonged dependency and improves with elevation. The pain is unrelated to activity.  The patient notes that in the morning the legs are significantly improved but they steadily worsened throughout the course of the day. The patient also notes a steady worsening of the discoloration in the ankle and shin area.   The patient denies claudication symptoms.  The patient denies symptoms consistent with rest pain.  The patient denies and extensive history of DJD and LS spine disease.  The patient has no had any past angiography, interventions or vascular surgery.  Elevation makes the leg symptoms better, dependency makes them much worse. There is no history of ulcerations. The patient denies any recent changes in medications.  The patient has not been wearing graduated compression.  The patient denies a history of DVT or PE. There is no prior history of phlebitis. There is no history of primary lymphedema.  No history of malignancies. No history of trauma or groin or pelvic surgery. There is no history of radiation treatment to the groin or pelvis  The patient denies amaurosis fugax or recent TIA symptoms. There are no recent neurological changes noted. The patient denies recent episodes of angina or shortness of breath  Current Meds  Medication Sig  . ASPIRIN 81 PO Take 81 mg by mouth daily.  Marland Kitchen atorvastatin (LIPITOR) 40 MG tablet Take 40 mg by mouth at bedtime.  Marland Kitchen lisinopril (ZESTRIL) 2.5 MG tablet Take 2.5 mg by mouth daily.  . metFORMIN (GLUCOPHAGE)  500 MG tablet Take 500 mg by mouth 2 (two) times daily with a meal.   . metoprolol succinate (TOPROL-XL) 50 MG 24 hr tablet Take 50 mg by mouth daily.  . Multiple Vitamin (MULTIVITAMIN) tablet Take 1 tablet by mouth daily.  Marland Kitchen oxybutynin (DITROPAN-XL) 5 MG 24 hr tablet Take 5 mg by mouth daily.  . Vitamin D3 (VITAMIN D) 25 MCG tablet Take 1,000 Units by mouth daily.    Past Medical History:  Diagnosis Date  . Diastolic dysfunction    a. 09/2018 Echo: EF 50-55%, mod LVH, diast dysfxn, Nl RV fxn, mildly dil LA. No significant valvular dzs.  . Essential hypertension   . Hyperlipidemia   . Irregular heart beat   . LBBB (left bundle branch block)   . Type 2 diabetes mellitus (HCC)   . Wears dentures    partial upper and lower    Past Surgical History:  Procedure Laterality Date  . CATARACT EXTRACTION W/PHACO Left 11/05/2019   Procedure: CATARACT EXTRACTION PHACO AND INTRAOCULAR LENS PLACEMENT (IOC) LEFT DIABETIC 8.71 00:52.8;  Surgeon: Galen Manila, MD;  Location: [facility];  Service: Ophthalmology;  Laterality: Left;  Diabetic - oral meds  . CATARACT EXTRACTION W/PHACO Right 11/26/2019   Procedure: CATARACT EXTRACTION PHACO AND INTRAOCULAR LENS PLACEMENT (IOC) RIGHT DIABETIC;  Surgeon: Galen Manila, MD;  Location: [facility];  Service: Ophthalmology;  Laterality: Right;  8.89 1:17.1  . KNEE ARTHROSCOPY Right     Social History Social History   Tobacco Use  . Smoking  status: Never Smoker  . Smokeless tobacco: Never Used  Vaping Use  . Vaping Use: Never used  Substance Use Topics  . Alcohol use: Yes    Comment: during the holidays   . Drug use: Not Currently    Family History Family History  Problem Relation Age of Onset  . Hypertension Mother   . COPD Father   . Lung cancer Father   . Diabetes Paternal Grandfather   . Heart Problems Son   . Breast cancer Neg Hx   No family history of bleeding/clotting disorders, porphyria or autoimmune  disease   Allergies  Allergen Reactions  . Bee Venom   . Penicillins Itching     REVIEW OF SYSTEMS (Negative unless checked)  Constitutional: [] Weight loss  [] Fever  [] Chills Cardiac: [] Chest pain   [] Chest pressure   [] Palpitations   [] Shortness of breath when laying flat   [] Shortness of breath with exertion. Vascular:  [] Pain in legs with walking   [x] Pain in legs at rest  [] History of DVT   [] Phlebitis   [x] Swelling in legs   [] Varicose veins   [] Non-healing ulcers Pulmonary:   [] Uses home oxygen   [] Productive cough   [] Hemoptysis   [] Wheeze  [] COPD   [] Asthma Neurologic:  [] Dizziness   [] Seizures   [] History of stroke   [] History of TIA  [] Aphasia   [] Vissual changes   [] Weakness or numbness in arm   [] Weakness or numbness in leg Musculoskeletal:   [] Joint swelling   [x] Joint pain   [] Low back pain Hematologic:  [] Easy bruising  [] Easy bleeding   [] Hypercoagulable state   [] Anemic Gastrointestinal:  [] Diarrhea   [] Vomiting  [] Gastroesophageal reflux/heartburn   [] Difficulty swallowing. Genitourinary:  [] Chronic kidney disease   [] Difficult urination  [] Frequent urination   [] Blood in urine Skin:  [] Rashes   [] Ulcers  Psychological:  [] History of anxiety   []  History of major depression.  Physical Examination  Vitals:   02/24/20 1310  BP: (!) 146/75  Pulse: 79  Resp: 16  Weight: 213 lb (96.6 kg)  Height: 5\' 9"  (1.753 m)   Body mass index is 31.45 kg/m. Gen: WD/WN, NAD Head: [place]/AT, No temporalis wasting.  Ear/Nose/Throat: Hearing grossly intact, nares w/o erythema or drainage, poor dentition Eyes: PER, EOMI, sclera nonicteric.  Neck: Supple, no masses.  No bruit or JVD.  Pulmonary:  Good air movement, clear to auscultation bilaterally, no use of accessory muscles.  Cardiac: RRR, normal S1, S2, no Murmurs. Vascular: scattered varicosities present bilaterally.  Moderate venous stasis changes to the legs bilaterally.  3-4+ soft pitting edema Vessel Right Left  Radial  Palpable Palpable  PT Palpable Palpable  DP Palpable Palpable  Gastrointestinal: soft, non-distended. No guarding/no peritoneal signs.  Musculoskeletal: M/S 5/5 throughout.  No deformity or atrophy.  Neurologic: CN 2-12 intact. Pain and light touch intact in extremities.  Symmetrical.  Speech is fluent. Motor exam as listed above. Psychiatric: Judgment intact, Mood & affect appropriate for pt's clinical situation. Dermatologic: Venous rashes no ulcers noted.  No changes consistent with cellulitis. Lymph : + lichenification / skin changes of chronic lymphedema.  CBC No results found for: WBC, HGB, HCT, MCV, PLT  BMET    Component Value Date/Time   NA 142 03/31/2017 0857   K 4.5 03/31/2017 0857   CL 107 03/31/2017 0857   CO2 29 03/31/2017 0857   GLUCOSE 127 (H) 03/31/2017 0857   BUN 24 (H) 03/31/2017 0857   CREATININE 1.18 03/31/2017 0857   CALCIUM 10.1  03/31/2017 0857   CrCl cannot be calculated (Patient's most recent lab result is older than the maximum 21 days allowed.).  COAG No results found for: INR, PROTIME  Radiology No results found.   Assessment/Plan 1. Lymphedema I have had a long discussion with the patient regarding swelling and why it  causes symptoms.  Patient will begin wearing graduated compression stockings class 1 (20-30 mmHg) on a daily basis a prescription was given. The patient will  beginning wearing the stockings first thing in the morning and removing them in the evening. The patient is instructed specifically not to sleep in the stockings.   In addition, behavioral modification will be initiated.  This will include frequent elevation, use of over the counter pain medications and exercise such as walking.  I have reviewed systemic causes for chronic edema such as liver, kidney and cardiac etiologies.  The patient denies problems with these organ systems.    Consideration for a lymph pump will also be made based upon the effectiveness of conservative  therapy.  This would help to improve the edema control and prevent sequela such as ulcers and infections   Patient should undergo duplex ultrasound of the venous system to ensure that DVT or reflux is not present.  The patient will follow-up with me after the ultrasound.   - VAS [place] LOWER EXTREMITY VENOUS REFLUX; Future  2. Chronic venous insufficiency I have had a long discussion with the patient regarding swelling and why it  causes symptoms.  Patient will begin wearing graduated compression stockings class 1 (20-30 mmHg) on a daily basis a prescription was given. The patient will  beginning wearing the stockings first thing in the morning and removing them in the evening. The patient is instructed specifically not to sleep in the stockings.   In addition, behavioral modification will be initiated.  This will include frequent elevation, use of over the counter pain medications and exercise such as walking.  I have reviewed systemic causes for chronic edema such as liver, kidney and cardiac etiologies.  The patient denies problems with these organ systems.    Consideration for a lymph pump will also be made based upon the effectiveness of conservative therapy.  This would help to improve the edema control and prevent sequela such as ulcers and infections   Patient should undergo duplex ultrasound of the venous system to ensure that DVT or reflux is not present.  The patient will follow-up with me after the ultrasound.   - VAS [place] LOWER EXTREMITY VENOUS REFLUX; Future  3. Essential hypertension Continue antihypertensive medications as already ordered, these medications have been reviewed and there are no changes at this time.   4. Type 2 diabetes mellitus without complication, without long-term current use of insulin (HCC) Continue hypoglycemic medications as already ordered, these medications have been reviewed and there are no changes at this time.  Hgb A1C to be monitored as already  arranged by primary service   5. Hyperlipidemia, unspecified hyperlipidemia type Continue statin as ordered and reviewed, no changes at this time     Levora Dredge, MD  03/07/2020 1:01 PM

## 2020-04-02 ENCOUNTER — Other Ambulatory Visit: Payer: Self-pay | Admitting: Nephrology

## 2020-04-02 DIAGNOSIS — R8290 Unspecified abnormal findings in urine: Secondary | ICD-10-CM

## 2020-04-02 DIAGNOSIS — N1832 Chronic kidney disease, stage 3b: Secondary | ICD-10-CM

## 2020-04-02 DIAGNOSIS — E1122 Type 2 diabetes mellitus with diabetic chronic kidney disease: Secondary | ICD-10-CM

## 2020-04-17 ENCOUNTER — Other Ambulatory Visit: Payer: Self-pay

## 2020-04-17 ENCOUNTER — Ambulatory Visit
Admission: RE | Admit: 2020-04-17 | Discharge: 2020-04-17 | Disposition: A | Discharge status: Home / Self Care | Payer: Medicare Other | Source: Ambulatory Visit | Attending: Nephrology | Admitting: Nephrology

## 2020-04-17 DIAGNOSIS — N1832 Chronic kidney disease, stage 3b: Secondary | ICD-10-CM | POA: Insufficient documentation

## 2020-04-17 DIAGNOSIS — E1122 Type 2 diabetes mellitus with diabetic chronic kidney disease: Secondary | ICD-10-CM | POA: Diagnosis present

## 2020-04-17 DIAGNOSIS — R8290 Unspecified abnormal findings in urine: Secondary | ICD-10-CM | POA: Diagnosis not present

## 2020-05-25 ENCOUNTER — Encounter (INDEPENDENT_AMBULATORY_CARE_PROVIDER_SITE_OTHER): Payer: Medicare Other

## 2020-05-25 ENCOUNTER — Ambulatory Visit (INDEPENDENT_AMBULATORY_CARE_PROVIDER_SITE_OTHER): Payer: Medicare Other | Admitting: Vascular Surgery

## 2020-06-16 ENCOUNTER — Other Ambulatory Visit (INDEPENDENT_AMBULATORY_CARE_PROVIDER_SITE_OTHER): Payer: Self-pay | Admitting: Vascular Surgery

## 2020-06-16 DIAGNOSIS — I872 Venous insufficiency (chronic) (peripheral): Secondary | ICD-10-CM

## 2020-06-16 DIAGNOSIS — I890 Lymphedema, not elsewhere classified: Secondary | ICD-10-CM

## 2020-06-18 ENCOUNTER — Encounter (INDEPENDENT_AMBULATORY_CARE_PROVIDER_SITE_OTHER): Payer: Medicare Other

## 2020-06-18 ENCOUNTER — Encounter (INDEPENDENT_AMBULATORY_CARE_PROVIDER_SITE_OTHER): Payer: Self-pay | Admitting: Nurse Practitioner

## 2020-06-18 ENCOUNTER — Ambulatory Visit (INDEPENDENT_AMBULATORY_CARE_PROVIDER_SITE_OTHER): Payer: Medicare Other | Admitting: Nurse Practitioner

## 2020-09-22 ENCOUNTER — Other Ambulatory Visit: Payer: Self-pay

## 2020-09-22 ENCOUNTER — Encounter: Payer: Self-pay | Admitting: Cardiology

## 2020-09-22 ENCOUNTER — Ambulatory Visit (INDEPENDENT_AMBULATORY_CARE_PROVIDER_SITE_OTHER): Payer: Medicare Other | Admitting: Cardiology

## 2020-09-22 VITALS — BP 130/64 | HR 59 | Ht 69.0 in | Wt 218.0 lb

## 2020-09-22 DIAGNOSIS — R0609 Other forms of dyspnea: Secondary | ICD-10-CM

## 2020-09-22 DIAGNOSIS — R609 Edema, unspecified: Secondary | ICD-10-CM | POA: Diagnosis not present

## 2020-09-22 DIAGNOSIS — I10 Essential (primary) hypertension: Secondary | ICD-10-CM | POA: Diagnosis not present

## 2020-09-22 DIAGNOSIS — E7800 Pure hypercholesterolemia, unspecified: Secondary | ICD-10-CM

## 2020-09-22 DIAGNOSIS — R0600 Dyspnea, unspecified: Secondary | ICD-10-CM

## 2020-09-22 NOTE — Patient Instructions (Signed)
Medication Instructions:  Your physician recommends that you continue on your current medications as directed. Please refer to the Current Medication list given to you today.  *If you need a refill on your cardiac medications before your next appointment, please call your pharmacy*   Lab Work: None ordered If you have labs (blood work) drawn today and your tests are completely normal, you will receive your results only by: MyChart Message (if you have MyChart) OR A paper copy in the mail If you have any lab test that is abnormal or we need to change your treatment, we will call you to review the results.   Testing/Procedures:  None ordered   Follow-Up: At [facility], you and your health needs are our priority.  As part of our continuing mission to provide you with exceptional heart care, we have created designated Provider Care Teams.  These Care Teams include your primary Cardiologist (physician) and Advanced Practice Providers (APPs -  Physician Assistants and Nurse Practitioners) who all work together to provide you with the care you need, when you need it.  We recommend signing up for the patient portal called "MyChart".  Sign up information is provided on this After Visit Summary.  MyChart is used to connect with patients for Virtual Visits (Telemedicine).  Patients are able to view lab/test results, encounter notes, upcoming appointments, etc.  Non-urgent messages can be sent to your provider as well.   To learn more about what you can do with MyChart, go to [email or web address].    Your next appointment:   Follow up as needed   The format for your next appointment:   In Person  Provider:   You may see Brian Agbor-Etang, MD or one of the following Advanced Practice Providers on your designated Care Team:   Christopher Berge, NP Ryan Dunn, PA-C Jacquelyn Visser, PA-C Cadence Furth, PA-C   Other Instructions   

## 2020-09-22 NOTE — Progress Notes (Signed)
Cardiology Office Note:    Date:  09/22/2020   ID:  Belinda Patton, DOB [birth year], MRN [id number]  PCP:  [facility]  [facility] Cardiologist:  Debbe Odea, MD  [facility] Electrophysiologist:  None   Referring MD: [facility], [facility]*   Chief Complaint  Patient presents with   Other    6 month follow up -- Meds reviewed verbally with patient.      History of Present Illness:    Belinda Patton is a 80 y.o. female with a hx of hypertension, hyperlipidemia, diabetes, CKD 3 who presents for follow-up.  Last seen for shortness of breath with exertion, this was attributed to deconditioning since symptoms improved with graduated exercising.  Aldactone was previously stopped due to hyperkalemia.  She was referred to nephrology for CKD management.  She feels well, shortness of breath overall is improving with graduated exercising.  She has swelling in her legs typically as the day progresses.  Uses compression stockings and leg elevation technique which helps.   Prior notes Echocardiogram 09/2018 normal systolic function, impaired relaxation, EF 50 to 55%, moderate LVH.   Past Medical History:  Diagnosis Date   Diastolic dysfunction    a. 09/2018 Echo: EF 50-55%, mod LVH, diast dysfxn, Nl RV fxn, mildly dil LA. No significant valvular dzs.   Essential hypertension    Hyperlipidemia    Irregular heart beat    LBBB (left bundle branch block)    Type 2 diabetes mellitus (HCC)    Wears dentures    partial upper and lower    Past Surgical History:  Procedure Laterality Date   CATARACT EXTRACTION W/PHACO Left 11/05/2019   Procedure: CATARACT EXTRACTION PHACO AND INTRAOCULAR LENS PLACEMENT (IOC) LEFT DIABETIC 8.71 00:52.8;  Surgeon: Galen Manila, MD;  Location: [facility];  Service: Ophthalmology;  Laterality: Left;  Diabetic - oral meds   CATARACT EXTRACTION W/PHACO Right 11/26/2019   Procedure: CATARACT EXTRACTION PHACO AND INTRAOCULAR  LENS PLACEMENT (IOC) RIGHT DIABETIC;  Surgeon: Galen Manila, MD;  Location: [facility];  Service: Ophthalmology;  Laterality: Right;  8.89 1:17.1   KNEE ARTHROSCOPY Right     Current Medications: Current Meds  Medication Sig   ASPIRIN 81 PO Take 81 mg by mouth daily.   atorvastatin (LIPITOR) 40 MG tablet Take 40 mg by mouth at bedtime.   lisinopril (ZESTRIL) 2.5 MG tablet Take 2.5 mg by mouth daily.   metFORMIN (GLUCOPHAGE) 500 MG tablet Take 500 mg by mouth 2 (two) times daily with a meal.    metoprolol succinate (TOPROL-XL) 50 MG 24 hr tablet Take 50 mg by mouth daily.   Multiple Vitamin (MULTIVITAMIN) tablet Take 1 tablet by mouth daily.   oxybutynin (DITROPAN-XL) 5 MG 24 hr tablet Take 5 mg by mouth daily.   Vitamin D3 (VITAMIN D) 25 MCG tablet Take 1,000 Units by mouth daily.     Allergies:   Bee venom and Penicillins   Social History   Socioeconomic History   Marital status: Widowed    Spouse name: Not on file   Number of children: Not on file   Years of education: Not on file   Highest education level: Not on file  Occupational History   Not on file  Tobacco Use   Smoking status: Never   Smokeless tobacco: Never  Vaping Use   Vaping Use: Never used  Substance and Sexual Activity   Alcohol use: Yes    Comment: during the holidays  Drug use: Not Currently   Sexual activity: Not on file  Other Topics Concern   Not on file  Social History Narrative   Widower.   Retired. 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   Enjoys spending time with grandchildren, gardening.   Social Determinants of Health   Financial Resource Strain: Not on file  Food Insecurity: Not on file  Transportation Needs: Not on file  Physical Activity: Not on file  Stress: Not on file  Social Connections: Not on file     Family History: The patient's family history includes COPD in her father; Diabetes in her paternal grandfather; Heart Problems in  her son; Hypertension in her mother; Lung cancer in her father. There is no history of Breast cancer.  ROS:   Please see the history of present illness.     All other systems reviewed and are negative.  EKGs/Labs/Other Studies Reviewed:    The following studies were reviewed today:   EKG:  EKG is  ordered today.  The ekg ordered today demonstrates normal sinus rhythm, left bundle branch block.  Recent Labs: No results found for requested labs within last 8760 hours.  Recent Lipid Panel    Component Value Date/Time   CHOL 132 03/31/2017 0857   TRIG 57.0 03/31/2017 0857   HDL 67.70 03/31/2017 0857   CHOLHDL 2 03/31/2017 0857   VLDL 11.4 03/31/2017 0857   LDLCALC 52 03/31/2017 0857     Risk Assessment/Calculations:      Physical Exam:    VS:  BP 130/64 (BP Location: Left Arm, Patient Position: Sitting, Cuff Size: Large)   Pulse (!) 59   Ht 5\' 9"  (1.753 m)   Wt 218 lb (98.9 kg)   SpO2 97%   BMI 32.19 kg/m     Wt Readings from Last 3 Encounters:  09/22/20 218 lb (98.9 kg)  02/28/20 215 lb (97.5 kg)  02/24/20 213 lb (96.6 kg)     GEN:  Well nourished, well developed in no acute distress HEENT: Normal NECK: No JVD; No carotid bruits LYMPHATICS: No lymphadenopathy CARDIAC: RRR, no murmurs, rubs, gallops RESPIRATORY:  Clear to auscultation without rales, wheezing or rhonchi  ABDOMEN: Soft, non-tender, non-distended MUSCULOSKELETAL:  1+ pedal edema; No deformity  SKIN: Warm and dry NEUROLOGIC:  Alert and oriented x 3 PSYCHIATRIC:  Normal affect   ASSESSMENT:    1. Dyspnea on exertion   2. Primary hypertension   3. Pure hypercholesterolemia   4. Dependent edema     PLAN:    In order of problems listed above:  Dyspnea on exertion, likely from deconditioning, overall improving with graduated exercising.  Last echo with preserved ejection fraction.  Patient encouraged to continue exercising.  Hypertension.  BP controlled.  Continue Toprol-XL,  lisinopril. Hyperlipidemia, continue Lipitor Lower extremity dependent edema.  Continue compression stockings, leg elevation while in seated position.   Follow-up as needed   Medication Adjustments/Labs and Tests Ordered: Current medicines are reviewed at length with the patient today.  Concerns regarding medicines are outlined above.  Orders Placed This Encounter  Procedures   EKG 12-Lead    No orders of the defined types were placed in this encounter.   Patient Instructions  Medication Instructions:  Your physician recommends that you continue on your current medications as directed. Please refer to the Current Medication list given to you today.  *If you need a refill on your cardiac medications before your next appointment, please call your pharmacy*  Lab Work: None ordered If you have labs (blood work) drawn today and your tests are completely normal, you will receive your results only by: MyChart Message (if you have MyChart) OR A paper copy in the mail If you have any lab test that is abnormal or we need to change your treatment, we will call you to review the results.   Testing/Procedures: None ordered   Follow-Up: At [facility], you and your health needs are our priority.  As part of our continuing mission to provide you with exceptional heart care, we have created designated Provider Care Teams.  These Care Teams include your primary Cardiologist (physician) and Advanced Practice Providers (APPs -  Physician Assistants and Nurse Practitioners) who all work together to provide you with the care you need, when you need it.  We recommend signing up for the patient portal called "MyChart".  Sign up information is provided on this After Visit Summary.  MyChart is used to connect with patients for Virtual Visits (Telemedicine).  Patients are able to view lab/test results, encounter notes, upcoming appointments, etc.  Non-urgent messages can be sent to your provider as  well.   To learn more about what you can do with MyChart, go to [email or web address].    Your next appointment:   Follow up as needed   The format for your next appointment:   In Person  Provider:   You may see Debbe Odea, MD or one of the following Advanced Practice Providers on your designated Care Team:   Nicolasa Ducking, NP Eula Listen, PA-C Marisue Ivan, PA-C [place], [place]   Other Instructions    Signed, Debbe Odea, MD  09/22/2020 9:06 AM    [facility]

## 2021-06-22 ENCOUNTER — Other Ambulatory Visit: Payer: Self-pay | Admitting: Family Medicine

## 2021-06-22 DIAGNOSIS — Z1231 Encounter for screening mammogram for malignant neoplasm of breast: Secondary | ICD-10-CM

## 2021-07-20 ENCOUNTER — Ambulatory Visit
Admission: RE | Admit: 2021-07-20 | Discharge: 2021-07-20 | Disposition: A | Discharge status: Home / Self Care | Payer: Medicare Other | Source: Ambulatory Visit | Attending: Family Medicine | Admitting: Family Medicine

## 2021-07-20 DIAGNOSIS — Z1231 Encounter for screening mammogram for malignant neoplasm of breast: Secondary | ICD-10-CM | POA: Insufficient documentation

## 2021-09-27 ENCOUNTER — Other Ambulatory Visit: Payer: Self-pay | Admitting: Family Medicine

## 2021-09-27 DIAGNOSIS — Z780 Asymptomatic menopausal state: Secondary | ICD-10-CM

## 2021-09-28 ENCOUNTER — Encounter: Payer: Self-pay | Admitting: Gastroenterology

## 2021-11-09 ENCOUNTER — Other Ambulatory Visit: Payer: Medicare Other

## 2022-02-08 ENCOUNTER — Ambulatory Visit: Payer: Medicare Other | Admitting: Gastroenterology

## 2022-02-17 ENCOUNTER — Ambulatory Visit: Payer: 59 | Admitting: Gastroenterology

## 2022-02-17 ENCOUNTER — Encounter: Payer: Self-pay | Admitting: Gastroenterology

## 2022-02-17 NOTE — Progress Notes (Deleted)
Gastroenterology Consultation  Referring Provider:     Bunnie Pion, FNP Primary Care Physician:  [facility], [facility] Primary Gastroenterologist:  Dr. Allen Norris     Reason for Consultation:     Inquire about a colonoscopy        HPI:   Belinda Patton is a 82 y.o. y/o female referred for consultation & management of inquire about a colonoscopy by Dr. Domingo Madeira, [facility]  This patient comes in today after being seen by her cardiology provider back in August and a referral being put in to see me.  Since most recent blood work showed her hemoglobin to be 10 with her hematocrit to be 33.  Echocardiogram [id number] normal systolic function, impaired relaxation, EF 50 to 55%, moderate LVH.    Past Medical History:  Diagnosis Date   Diastolic dysfunction    a. 09/2018 Echo: EF 50-55%, mod LVH, diast dysfxn, Nl RV fxn, mildly dil LA. No significant valvular dzs.   Essential hypertension    Hyperlipidemia    Irregular heart beat    LBBB (left bundle branch block)    Type 2 diabetes mellitus ([place])    Wears dentures    partial upper and lower    Past Surgical History:  Procedure Laterality Date   CATARACT EXTRACTION W/PHACO Left 11/05/2019   Procedure: CATARACT EXTRACTION PHACO AND INTRAOCULAR LENS PLACEMENT (IOC) LEFT DIABETIC 8.71 00:52.8;  Surgeon: Birder Robson, MD;  Location: [place];  Service: Ophthalmology;  Laterality: Left;  Diabetic - oral meds   CATARACT EXTRACTION W/PHACO Right 11/26/2019   Procedure: CATARACT EXTRACTION PHACO AND INTRAOCULAR LENS PLACEMENT ([place]) RIGHT DIABETIC;  Surgeon: Birder Robson, MD;  Location: [place];  Service: Ophthalmology;  Laterality: Right;  8.89 1:17.1   KNEE ARTHROSCOPY Right     Prior to Admission medications   Medication Sig Start Date End Date Taking? Authorizing Provider  ASPIRIN 81 PO Take 81 mg by mouth daily. 04/03/19   [provider]  atorvastatin (LIPITOR) 40 MG tablet Take 40 mg  by mouth at bedtime. 04/03/19   [provider]  lisinopril (ZESTRIL) 2.5 MG tablet Take 2.5 mg by mouth daily.    [provider]  metFORMIN (GLUCOPHAGE) 500 MG tablet Take 500 mg by mouth 2 (two) times daily with a meal.     [provider]  metoprolol succinate (TOPROL-XL) 50 MG 24 hr tablet Take 50 mg by mouth daily. 09/24/19   [provider]  Multiple Vitamin (MULTIVITAMIN) tablet Take 1 tablet by mouth daily.    [provider]  oxybutynin (DITROPAN-XL) 5 MG 24 hr tablet Take 5 mg by mouth daily.    [provider]  Vitamin D3 (VITAMIN D) 25 MCG tablet Take 1,000 Units by mouth daily.    [provider]    Family History  Problem Relation Age of Onset   Hypertension Mother    COPD Father    Lung cancer Father    Diabetes Paternal Grandfather    Heart Problems Son    Breast cancer Neg Hx      Social History   Tobacco Use   Smoking status: Never   Smokeless tobacco: Never  Vaping Use   Vaping Use: Never used  Substance Use Topics   Alcohol use: Yes    Comment: during the holidays    Drug use: Not Currently    Allergies as of 02/17/2022 - Review Complete 09/22/2020  Allergen Reaction Noted  Bee venom  08/29/2018   Penicillins Itching 03/29/2017    Review of Systems:    All systems reviewed and negative except where noted in HPI.   Physical Exam:  There were no vitals taken for this visit. No LMP recorded. Patient is postmenopausal. General:   Alert,  Well-developed, well-nourished, pleasant and cooperative in NAD Head:  Normocephalic and atraumatic. Eyes:  Sclera clear, no icterus.   Conjunctiva pink. Ears:  Normal auditory acuity. Neck:  Supple; no masses or thyromegaly. Lungs:  Respirations even and unlabored.  Clear throughout to auscultation.   No wheezes, crackles, or rhonchi. No acute distress. Heart:  Regular rate and rhythm; no murmurs, clicks, rubs, or gallops. Abdomen:  Normal bowel sounds.   No bruits.  Soft, non-tender and non-distended without masses, hepatosplenomegaly or hernias noted.  No guarding or rebound tenderness.  Negative Carnett sign.   Rectal:  Deferred.  Pulses:  Normal pulses noted. Extremities:  No clubbing or edema.  No cyanosis. Neurologic:  Alert and oriented x3;  grossly normal neurologically. Skin:  Intact without significant lesions or rashes.  No jaundice. Lymph Nodes:  No significant cervical adenopathy. Psych:  Alert and cooperative. Normal mood and affect.  Imaging Studies: No results found.  Assessment and Plan:   [name] is a 82 y.o. y/o female ***    Lucilla Lame, MD. Marval Regal    Note: This dictation was prepared with Dragon dictation along with smaller phrase technology. Any transcriptional errors that result from this process are unintentional.

## 2022-09-01 ENCOUNTER — Other Ambulatory Visit: Payer: Self-pay | Admitting: Family Medicine

## 2022-09-01 DIAGNOSIS — Z1231 Encounter for screening mammogram for malignant neoplasm of breast: Secondary | ICD-10-CM

## 2022-09-20 ENCOUNTER — Ambulatory Visit
Admission: RE | Admit: 2022-09-20 | Discharge: 2022-09-20 | Disposition: A | Discharge status: Home / Self Care | Payer: 59 | Source: Ambulatory Visit | Attending: Family Medicine | Admitting: Family Medicine

## 2022-09-20 DIAGNOSIS — Z1231 Encounter for screening mammogram for malignant neoplasm of breast: Secondary | ICD-10-CM | POA: Diagnosis present

## 2023-10-09 ENCOUNTER — Other Ambulatory Visit: Payer: Self-pay | Admitting: Family Medicine

## 2023-10-09 DIAGNOSIS — Z1231 Encounter for screening mammogram for malignant neoplasm of breast: Secondary | ICD-10-CM

## 2023-11-02 ENCOUNTER — Ambulatory Visit
Admission: RE | Admit: 2023-11-02 | Discharge: 2023-11-02 | Disposition: A | Discharge status: Home / Self Care | Source: Ambulatory Visit | Attending: Family Medicine | Admitting: Family Medicine

## 2023-11-02 DIAGNOSIS — Z1231 Encounter for screening mammogram for malignant neoplasm of breast: Secondary | ICD-10-CM | POA: Insufficient documentation

## 2024-03-26 IMAGING — MG MM DIGITAL SCREENING BILAT W/ TOMO AND CAD
6 of 10 series · 6 of 30 positions shown · non-contrast
Comparison: Previous exam(s).

CLINICAL DATA: Screening.

EXAM:
DIGITAL SCREENING BILATERAL MAMMOGRAM WITH TOMOSYNTHESIS AND CAD
TECHNIQUE: Bilateral screening digital craniocaudal and mediolateral oblique
mammograms were obtained. Bilateral screening digital breast
tomosynthesis was performed. The images were evaluated with
computer-aided detection.

[L CC synth-2D]
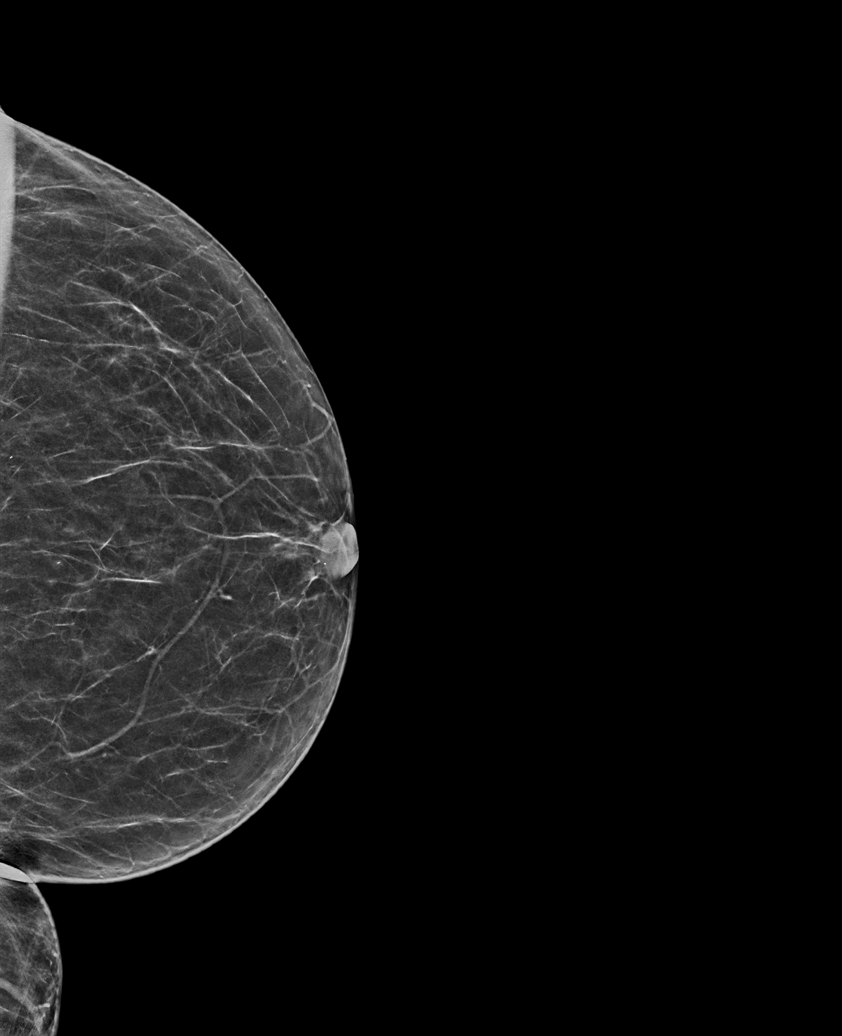

[R CC synth-2D]
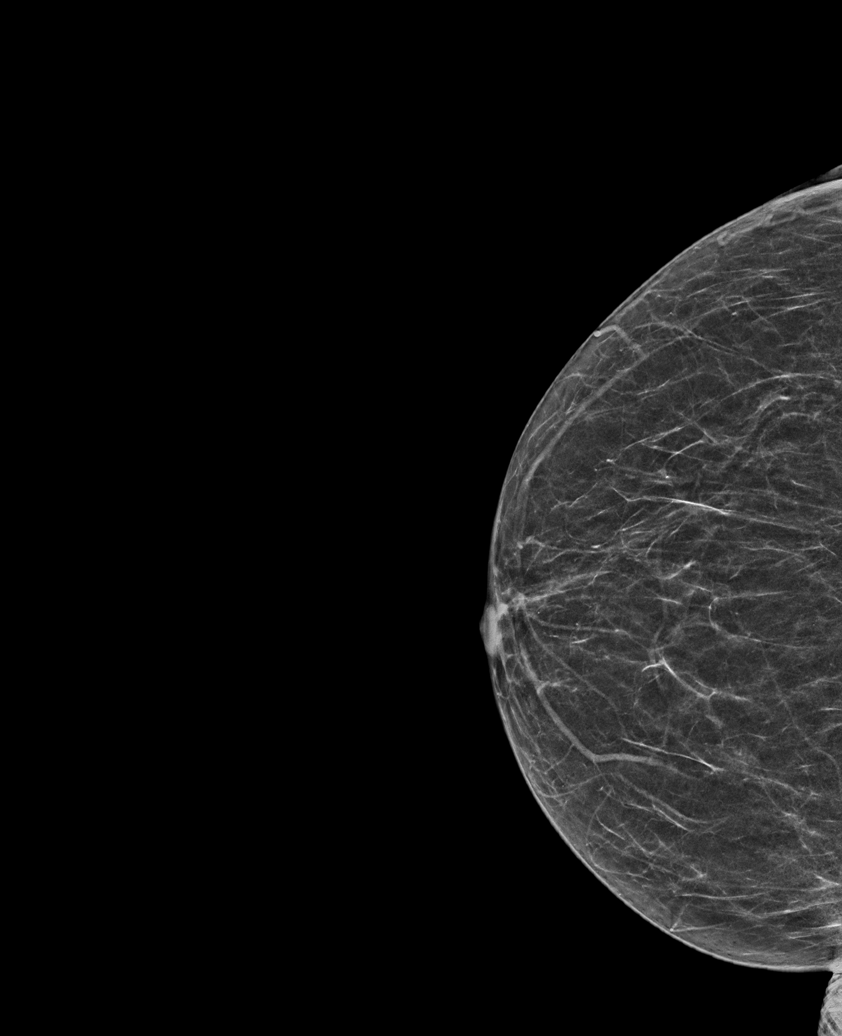

[L MLO synth-2D]
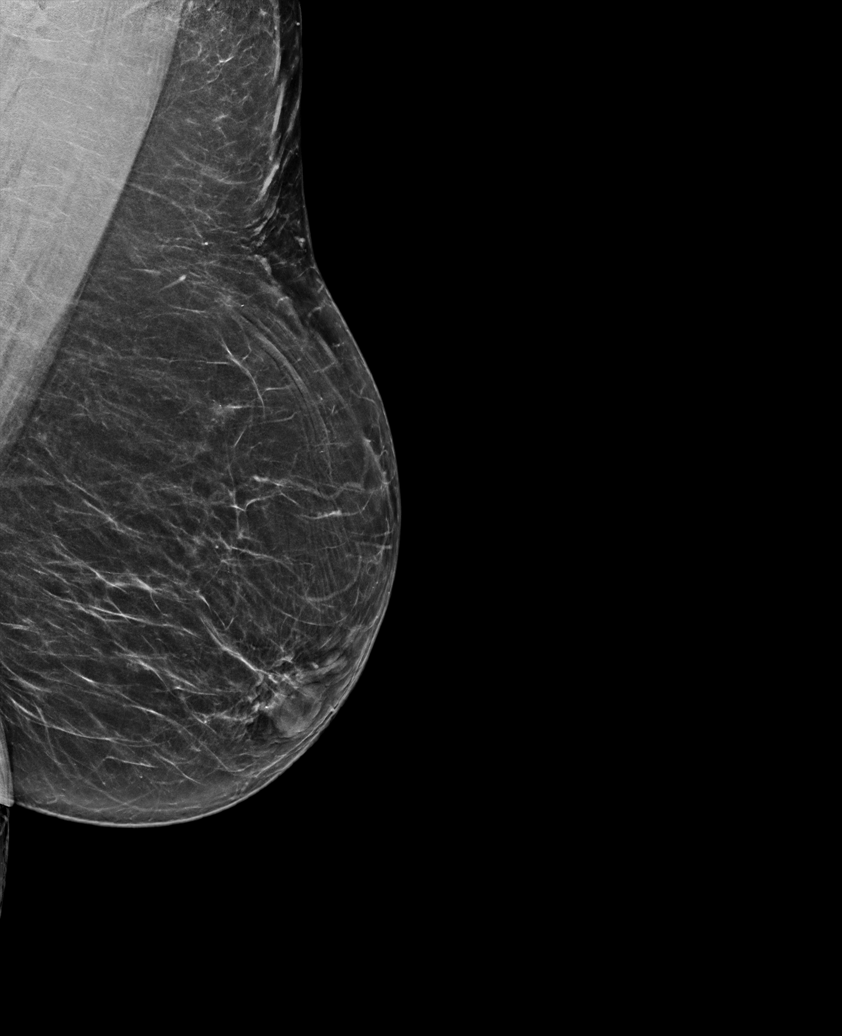

[R MLO synth-2D]
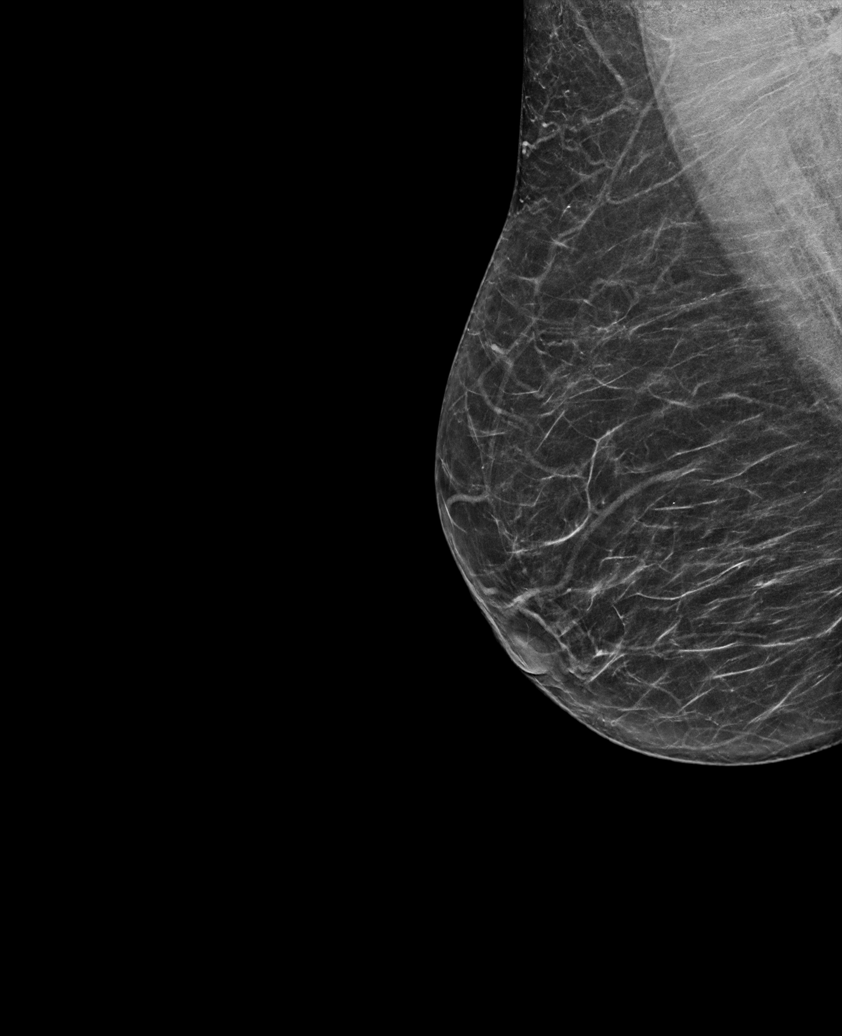

[L XCCL synth-2D]
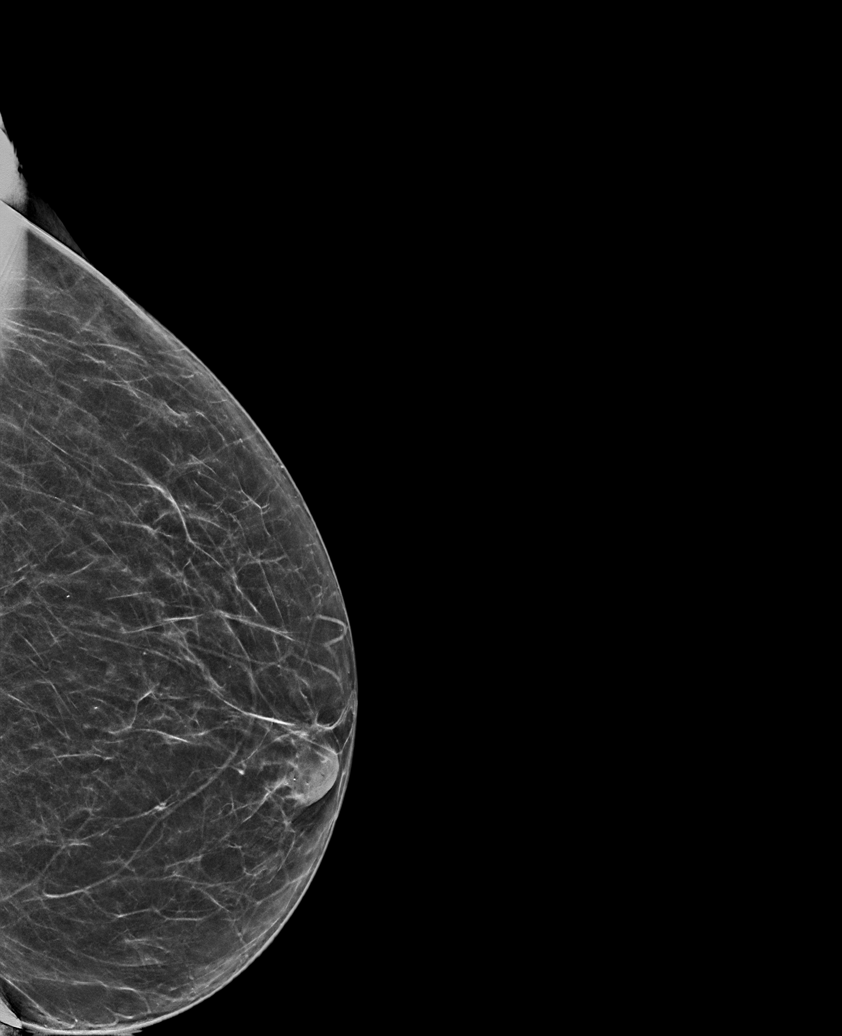

[L CC tomo · tomo slice 31/61.0]
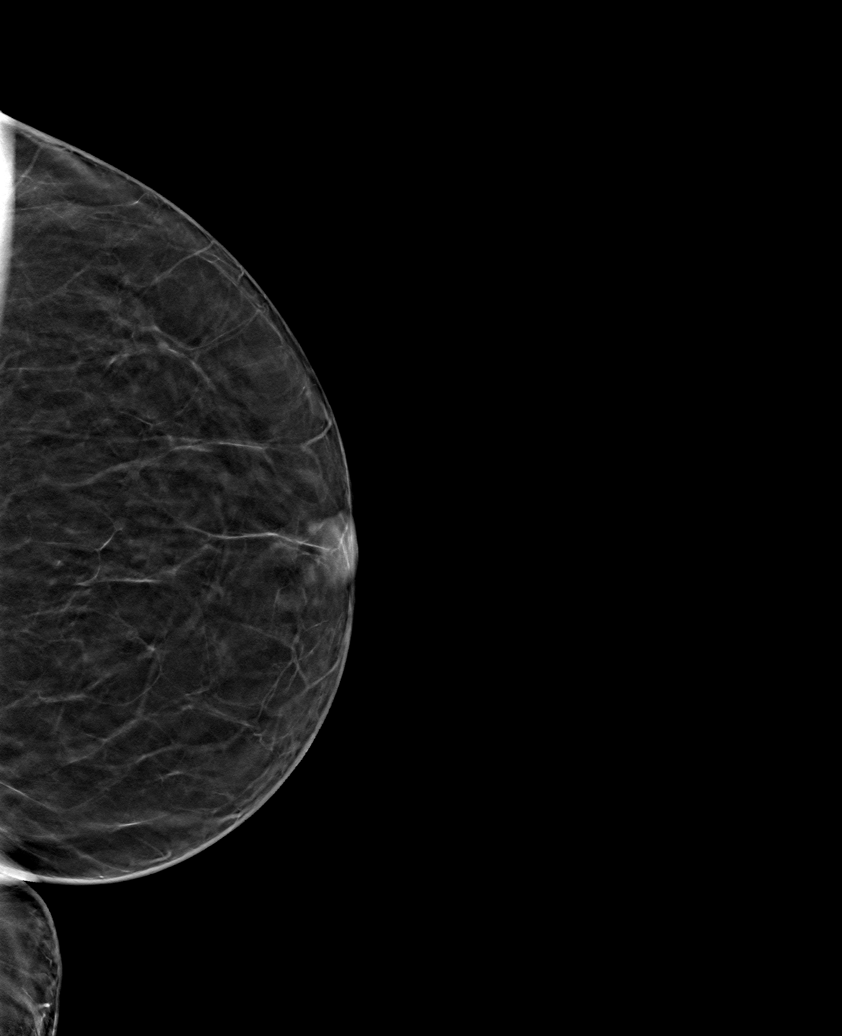

[6 of 30 positions shown; findings below may reference images not displayed]

ACR Breast Density Category b: There are scattered areas of
fibroglandular density.
FINDINGS: There are no findings suspicious for malignancy.
IMPRESSION: No mammographic evidence of malignancy. A result letter of this
screening mammogram will be mailed directly to the patient.

RECOMMENDATION:
Screening mammogram in one year. (Code:51-O-LD2)

BI-RADS CATEGORY  1: Negative.
